# Patient Record
Sex: Male | Born: 1970 | Race: White | Hispanic: No | Marital: Single | Smoking: Never smoker
Health system: Southern US, Community
[De-identification: ages and names within clinical notes are randomized; demographics above are authoritative.]

## PROBLEM LIST (undated history)

## (undated) DIAGNOSIS — E785 Hyperlipidemia, unspecified: Secondary | ICD-10-CM

## (undated) DIAGNOSIS — M109 Gout, unspecified: Secondary | ICD-10-CM

## (undated) DIAGNOSIS — I10 Essential (primary) hypertension: Secondary | ICD-10-CM

## (undated) HISTORY — PX: DENTAL SURGERY: SHX609

---

## 2001-06-20 ENCOUNTER — Encounter: Payer: Self-pay | Admitting: Emergency Medicine

## 2001-06-20 ENCOUNTER — Emergency Department (HOSPITAL_COMMUNITY): Admission: EM | Admit: 2001-06-20 | Discharge: 2001-06-20 | Payer: Self-pay | Admitting: Emergency Medicine

## 2001-06-24 ENCOUNTER — Ambulatory Visit (HOSPITAL_COMMUNITY): Admission: RE | Admit: 2001-06-24 | Discharge: 2001-06-24 | Payer: Self-pay | Admitting: Cardiology

## 2003-07-01 ENCOUNTER — Emergency Department (HOSPITAL_COMMUNITY): Admission: EM | Admit: 2003-07-01 | Discharge: 2003-07-01 | Payer: Self-pay | Admitting: *Deleted

## 2003-12-28 ENCOUNTER — Emergency Department (HOSPITAL_COMMUNITY): Admission: EM | Admit: 2003-12-28 | Discharge: 2003-12-28 | Payer: Self-pay | Admitting: Emergency Medicine

## 2004-07-04 ENCOUNTER — Inpatient Hospital Stay (HOSPITAL_COMMUNITY): Admission: EM | Admit: 2004-07-04 | Discharge: 2004-07-06 | Payer: Self-pay | Admitting: Emergency Medicine

## 2004-07-04 ENCOUNTER — Ambulatory Visit: Payer: Self-pay | Admitting: Cardiology

## 2005-03-22 ENCOUNTER — Emergency Department (HOSPITAL_COMMUNITY): Admission: EM | Admit: 2005-03-22 | Discharge: 2005-03-22 | Payer: Self-pay | Admitting: Emergency Medicine

## 2011-05-04 ENCOUNTER — Emergency Department (HOSPITAL_COMMUNITY)
Admission: EM | Admit: 2011-05-04 | Discharge: 2011-05-04 | Disposition: A | Discharge status: Home / Self Care | Payer: Self-pay | Attending: Emergency Medicine | Admitting: Emergency Medicine

## 2011-05-04 ENCOUNTER — Emergency Department (HOSPITAL_COMMUNITY): Payer: Self-pay

## 2011-05-04 ENCOUNTER — Encounter (HOSPITAL_COMMUNITY): Payer: Self-pay

## 2011-05-04 DIAGNOSIS — W208XXA Other cause of strike by thrown, projected or falling object, initial encounter: Secondary | ICD-10-CM | POA: Insufficient documentation

## 2011-05-04 DIAGNOSIS — R209 Unspecified disturbances of skin sensation: Secondary | ICD-10-CM | POA: Insufficient documentation

## 2011-05-04 DIAGNOSIS — S93409A Sprain of unspecified ligament of unspecified ankle, initial encounter: Secondary | ICD-10-CM | POA: Insufficient documentation

## 2011-05-04 DIAGNOSIS — M25579 Pain in unspecified ankle and joints of unspecified foot: Secondary | ICD-10-CM | POA: Insufficient documentation

## 2011-05-04 MED ORDER — HYDROCODONE-ACETAMINOPHEN 5-325 MG PO TABS: ORAL_TABLET | ORAL | Status: DC

## 2011-05-04 NOTE — ED Provider Notes (Signed)
History     CSN: [id number]  Arrival date & time 05/04/11  1247   First MD Initiated Contact with Patient 05/04/11 1403      Chief Complaint  Patient presents with  . Ankle Pain    (Consider location/radiation/quality/duration/timing/severity/associated sxs/prior treatment) Patient is a 41 y.o. male presenting with ankle pain. The history is provided by the patient.  Ankle Pain  The incident occurred 3 to 5 hours ago. Injury mechanism: Tree limb fell on the right ankle. The pain is present in the right ankle. The quality of the pain is described as sharp. The pain is moderate. The pain has been constant since onset. Associated symptoms include tingling. Pertinent negatives include no loss of sensation. He reports no foreign bodies present. The symptoms are aggravated by bearing weight and palpation. He has tried nothing for the symptoms.    History reviewed. No pertinent past medical history.  History reviewed. No pertinent past surgical history.  No family history on file.  History  Substance Use Topics  . Smoking status: Never Smoker   . Smokeless tobacco: Not on file  . Alcohol Use: Yes      Review of Systems  Constitutional: Negative for activity change.       All ROS Neg except as noted in HPI  HENT: Negative for nosebleeds and neck pain.   Eyes: Negative for photophobia and discharge.  Respiratory: Negative for cough, shortness of breath and wheezing.   Cardiovascular: Negative for chest pain and palpitations.  Gastrointestinal: Negative for abdominal pain and blood in stool.  Genitourinary: Negative for dysuria, frequency and hematuria.  Musculoskeletal: Negative for back pain and arthralgias.  Skin: Negative.   Neurological: Positive for tingling. Negative for dizziness, seizures and speech difficulty.  Psychiatric/Behavioral: Negative for hallucinations and confusion.    Allergies  Review of patient's allergies indicates no known allergies.  Home  Medications   Current Outpatient Rx  Name Route Sig Dispense Refill  . ROSUVASTATIN CALCIUM 20 MG PO TABS Oral Take 20 mg by mouth daily.      BP 161/89  Pulse 73  Temp(Src) 98 F (36.7 C) (Oral)  Resp 18  Ht 6' (1.829 m)  Wt 245 lb (111.131 kg)  BMI 33.23 kg/m2  SpO2 99%  Physical Exam  Nursing note and vitals reviewed. Constitutional: He is oriented to person, place, and time. He appears well-developed and well-nourished.  Non-toxic appearance.  HENT:  Head: Normocephalic.  Right Ear: Tympanic membrane and external ear normal.  Left Ear: Tympanic membrane and external ear normal.  Eyes: EOM and lids are normal. Pupils are equal, round, and reactive to light.  Neck: Normal range of motion. Neck supple. Carotid bruit is not present.  Cardiovascular: Normal rate, regular rhythm, normal heart sounds, intact distal pulses and normal pulses.   Pulmonary/Chest: Breath sounds normal. No respiratory distress.  Abdominal: Soft. Bowel sounds are normal. There is no tenderness. There is no guarding.  Musculoskeletal: Normal range of motion.       Pain of the lateral malleolus. FROM of the toes. Achilles intact.  Lymphadenopathy:       Head (right side): No submandibular adenopathy present.       Head (left side): No submandibular adenopathy present.    He has no cervical adenopathy.  Neurological: He is alert and oriented to person, place, and time. He has normal strength. No cranial nerve deficit or sensory deficit.  Skin: Skin is warm and dry.  Psychiatric: He has a normal mood  and affect. His speech is normal.    ED Course  Procedures (including critical care time)  Labs Reviewed - No data to display Dg Ankle Complete Right  05/04/2011  *RADIOLOGY REPORT*  Clinical Data: Right-sided ankle pain.  RIGHT ANKLE - COMPLETE 3+ VIEW  Comparison: No priors.  Findings: Three views of the right ankle demonstrate no acute fracture, subluxation, dislocation or joint abnormality.  There is a  small amount of soft tissue thickening overlying the lateral malleolus.  IMPRESSION: 1.  No acute radiographic abnormality of the bones of the right ankle.  Original Report Authenticated By: Florencia Reasons, M.D.     1. Ankle sprain       MDM  I have reviewed nursing notes, vital signs, and all appropriate lab and imaging results for this patient. The ankle x-rays are negative. Patient fitted with an ASO splint. Crutches offered but declined. Prescription for Norco given. Patient is to have ibuprofen 3 times daily.       Kathie Dike, [place] 05/04/11 [phone]

## 2011-05-04 NOTE — ED Notes (Signed)
Pt presents with right ankle pain when a tree limb fell on ankle today. Pt ambulates with steady gate.

## 2011-05-04 NOTE — Discharge Instructions (Signed)
Dear ankle a 3 is negative for fracture or dislocation. Please use the ankle splint for the next 10-14 days. Please do not sleep in a splint. Please use ibuprofen 3 times daily with food for inflammation. Please use Norco for pain if needed. This medication may cause drowsiness, please use with caution.Ankle Sprain You have a sprained ankle. When you twist or sprain your ankle, the ligaments that hold the joint together are injured. This usually causes a lot of swelling and pain. Although these injuries can be quite severe, proper treatment will reduce your pain, shorten the period of disability, and help prevent re-injury. To treat a sprained ankle you should:  Elevate your ankle for the next 2 to 4 days.   During this period apply ice packs to the injury for 20 to 30 minutes every 2 to 3 hours.   Keep the ankle wrapped in a compression bandage or splint as long as it is painful or swollen.   Do not walk on your ankle if it still hurts. This can slow the healing. Gentle range of motion exercises, however, can help decrease disability.   Use crutches if necessary until weight bearing is painless.   Prescription pain medicine may be needed to relieve discomfort.  A plaster or fiberglass splint may be applied initially. As your sprain improves, air, foam or gel-lined braces can be used to protect the ankle from further injury until the joint is completely healed. Ankle rehabilitation exercises may also be used to speed your recovery and make the joint more stable. Most moderate ankle sprains will heal completely in 6 weeks. However, if the sprain is severe, a cast or even surgery may be needed. Restrict your activities and see your doctor for follow-up as advised. If you have persistent pain, further evaluation and x-rays may be needed. Document Released: 03/22/2004 Document Revised: 02/01/2011 Document Reviewed: 02/14/2008 [facility] Patient Information 2012 [place], [place].

## 2011-05-04 NOTE — ED Notes (Signed)
Pain rt ankle, onset this am when cutting limb off tree and fell on ankle

## 2011-05-06 NOTE — ED Provider Notes (Signed)
Medical screening examination/treatment/procedure(s) were performed by non-physician practitioner and as supervising physician I was immediately available for consultation/collaboration.   Shelda Jakes, MD 05/06/11 2025

## 2014-07-13 ENCOUNTER — Encounter (HOSPITAL_COMMUNITY): Payer: Self-pay | Admitting: Emergency Medicine

## 2014-07-13 ENCOUNTER — Emergency Department (HOSPITAL_COMMUNITY)
Admission: EM | Admit: 2014-07-13 | Discharge: 2014-07-13 | Disposition: A | Discharge status: Home / Self Care | Payer: 59 | Attending: Emergency Medicine | Admitting: Emergency Medicine

## 2014-07-13 DIAGNOSIS — Z8639 Personal history of other endocrine, nutritional and metabolic disease: Secondary | ICD-10-CM | POA: Diagnosis not present

## 2014-07-13 DIAGNOSIS — T7840XA Allergy, unspecified, initial encounter: Secondary | ICD-10-CM | POA: Diagnosis not present

## 2014-07-13 DIAGNOSIS — R000 Tachycardia, unspecified: Secondary | ICD-10-CM | POA: Insufficient documentation

## 2014-07-13 DIAGNOSIS — R21 Rash and other nonspecific skin eruption: Secondary | ICD-10-CM | POA: Diagnosis present

## 2014-07-13 HISTORY — DX: Hyperlipidemia, unspecified: E78.5

## 2014-07-13 MED ORDER — FAMOTIDINE IN NACL 20-0.9 MG/50ML-% IV SOLN
20.0000 mg | INTRAVENOUS | Status: AC
  Administered 2014-07-13: 20 mg via INTRAVENOUS
  Filled 2014-07-13: qty 50

## 2014-07-13 MED ORDER — DIPHENHYDRAMINE HCL 50 MG/ML IJ SOLN
50.0000 mg | Freq: Once | INTRAMUSCULAR | Status: AC
  Administered 2014-07-13: 50 mg via INTRAVENOUS
  Filled 2014-07-13: qty 1

## 2014-07-13 MED ORDER — EPINEPHRINE 0.3 MG/0.3ML IJ SOAJ: 0.3000 mg | Freq: Once | INTRAMUSCULAR | Status: DC | PRN

## 2014-07-13 MED ORDER — FAMOTIDINE 20 MG PO TABS: 20.0000 mg | ORAL_TABLET | Freq: Two times a day (BID) | ORAL | Status: DC

## 2014-07-13 MED ORDER — METHYLPREDNISOLONE SODIUM SUCC 125 MG IJ SOLR
125.0000 mg | Freq: Once | INTRAMUSCULAR | Status: AC
  Administered 2014-07-13: 125 mg via INTRAVENOUS
  Filled 2014-07-13: qty 2

## 2014-07-13 MED ORDER — PREDNISONE 20 MG PO TABS: 40.0000 mg | ORAL_TABLET | Freq: Every day | ORAL | Status: DC

## 2014-07-13 MED ORDER — EPINEPHRINE 0.3 MG/0.3ML IJ SOAJ
0.3000 mg | Freq: Once | INTRAMUSCULAR | Status: DC
  Filled 2014-07-13: qty 0.3

## 2014-07-13 MED ORDER — DIPHENHYDRAMINE HCL 25 MG PO TABS: 25.0000 mg | ORAL_TABLET | Freq: Four times a day (QID) | ORAL | Status: DC | PRN

## 2014-07-13 NOTE — ED Notes (Signed)
Pt currently resting without any difficulties, breathing fine, VSS.

## 2014-07-13 NOTE — ED Notes (Signed)
Pt states that he ate a burger and hotdog for lunch and then about 2 hours later he started having diarrhea and started breaking out in hives and itching.  Stated that he felt his throat was closing up some but feels better now.

## 2014-07-13 NOTE — Discharge Instructions (Signed)
You have been diagnosed with an allergic reaction. Usually allergic reactions like this are caused by exposures to something that you either ate or touched or smelled.  It may be related to a number of different exposures including a new perfume, topical creams, soaps, detergents, linens, clothing, medications. Occasionally we do not find an answer for why there is an allergic reaction. These are treated the same way including Benadryl as needed for itching and rash. (This can be used up to 50 mg every 6 hours as needed).  Pepcid 20 mg every night and prednisone once a day for 5 days. Please do not take the Benadryl and drive or take care of children or other imported duties as the Benadryl can make you sleepy.   ° °If you should develop severe or worsening symptoms including difficulty breathing, difficulty swallowing, wheezing or increased coughing or a rash that developed on the inside of your mouth or a worsening rash on your skin, return to the hospital immediately for a recheck. Please call your Dr. in the morning for a recheck in 2 days if you are still having symptoms. If you do not have a Dr. see the list below.  If we have identified the source of your allergic reaction, please avoid this at all costs. This means stopping the medication if it is a new medication or a voiding topical exposures such as creams lotions body soaps or deodorants if this is the source. ° °Allergic Reaction, Mild to Moderate °Allergies may happen from anything your body is sensitive to. This may be food, medications, pollens, chemicals, and nearly anything around you in everyday life that produces allergens. An allergen is anything that causes an allergy producing substance. Allergens cause your body to release allergic antibodies. Through a chain of events, they cause a release of histamine into the blood stream. Histamines are meant to protect you, but they also cause your discomfort. This is why antihistamines are often used  for allergies. Heredity is often a factor in causing allergic reactions. This means you may have some of the same allergies as your parents. °Allergies happen in all age groups. You may have some idea of what caused your reaction. There are many allergens around us. It may be difficult to know what caused your reaction. If this is a first time event, it may never happen again. Allergies cannot be cured but can be controlled with medications. °SYMPTOMS  °You may get some or all of the following problems from allergies. °· Swelling and itching in and around the mouth.  °· Tearing, itchy eyes.  °· Nasal congestion and runny nose.  °· Sneezing and coughing.  °· An itchy red rash or hives.  °· Vomiting or diarrhea.  °· Difficulty breathing.  °Seasonal allergies occur in all age groups. They are seasonal because they usually occur during the same season every year. They may be a reaction to molds, grass pollens, or tree pollens. Other causes of allergies are house dust mite allergens, pet dander and mold spores. These are just a common few of the thousands of allergens around us. All of the symptoms listed above happen when you come in contact with pollens and other allergens. Seasonal allergies are usually not life threatening. They are generally more of a nuisance that can often be handled using medications. °Hay fever is a combination of all or some of the above listed allergy problems. It may often be treated with simple over-the-counter medications such as diphenhydramine. Take medication as   insert for child dosages. TREATMENT AND HOME CARE INSTRUCTIONS If hives or rash are present: Take medications as directed.  You may use an over-the-counter antihistamine (diphenhydramine) for hives and itching as needed. Do not drive or drink alcohol until medications used to treat the reaction have worn off. Antihistamines tend to make people sleepy.  Apply cold cloths (compresses) to the  skin or take baths in cool water. This will help itching. Avoid hot baths or showers. Heat will make a rash and itching worse.  If your allergies persist and become more severe, and over the counter medications are not effective, there are many new medications your caretaker can prescribe. Immunotherapy or desensitizing injections can be used if all else fails. Follow up with your caregiver if problems continue.  SEEK MEDICAL CARE IF:  Your allergies are becoming progressively more troublesome.  You suspect a food allergy. Symptoms generally happen within 30 minutes of eating a food.  Your symptoms have not gone away within 2 days or are getting worse.  You develop new symptoms.  You want to retest yourself or your child with a food or drink you think causes an allergic reaction. Never test yourself or your child of a suspected allergy without being under the watchful eye of your caregivers. A second exposure to an allergen may be life-threatening.  SEEK IMMEDIATE MEDICAL CARE IF: You develop difficulty breathing or wheezing, or have a tight feeling in your chest or throat.  You develop a swollen mouth, hives, swelling, or itching all over your body.  A severe reaction with any of the above problems should be considered life-threatening. If you suddenly develop difficulty breathing call for local emergency medical help. THIS IS AN EMERGENCY. MAKE SURE YOU:  Understand these instructions.  Will watch your condition.  Will get help right away if you are not doing well or get worse.  Document Released: 12/10/2006 Document Revised: 02/01/2011 Document Reviewed: 12/10/2006 [employer] Patient Information 2012 [employer].      

## 2014-07-13 NOTE — ED Notes (Signed)
PT presents to ED with lip swelling, hives and redness to body with reported tightness in his throat. PT not sure of allergen.

## 2014-07-13 NOTE — ED Provider Notes (Signed)
CSN: [id number]     Arrival date & time 07/13/14  1810 History   First MD Initiated Contact with Patient 07/13/14 1820     Chief Complaint  Patient presents with  . Allergic Reaction     (Consider location/radiation/quality/duration/timing/severity/associated sxs/prior Treatment) HPI Comments: The patient is a 45 year old male, he has a history of prior hypercholesterolemia but no longer takes medications. He states that just prior to arrival he was at home, he sat down to use the bathroom and developed diarrhea with acute onset of redness itching and a flushed feeling all over his body. This was associated with a tightness in his throat and numbness of his tongue. He has never had an allergic reaction to anything. His symptoms are persistent, moderate to severe, nothing makes this better or worse. He was not given any medication prior to arrival. He denies any changes in her contacts including topical, oral, inhaled or ingested  Patient is a 44 y.o. male presenting with allergic reaction. The history is provided by the patient.  Allergic Reaction   Past Medical History  Diagnosis Date  . Hyperlipemia    Past Surgical History  Procedure Laterality Date  . Dental surgery     History reviewed. No pertinent family history. History  Substance Use Topics  . Smoking status: Never Smoker   . Smokeless tobacco: Not on file  . Alcohol Use: Yes     Comment: occ    Review of Systems  All other systems reviewed and are negative.     Allergies  Review of patient's allergies indicates no known allergies.  Home Medications   Prior to Admission medications   Medication Sig Start Date End Date Taking? Authorizing Provider  diphenhydrAMINE (BENADRYL) 25 MG tablet Take 1 tablet (25 mg total) by mouth every 6 (six) hours as needed for itching (Rash). 07/13/14   Noemi Chapel, MD  EPINEPHrine (EPIPEN 2-PAK) 0.3 mg/0.3 mL IJ SOAJ injection Inject 0.3 mLs (0.3 mg total) into the muscle once as  needed (for severe allergic reaction). CAll 911 immediately if you have to use this medicine 07/13/14   Noemi Chapel, MD  famotidine (PEPCID) 20 MG tablet Take 1 tablet (20 mg total) by mouth 2 (two) times daily. 07/13/14   Noemi Chapel, MD  HYDROcodone-acetaminophen [facility]) 5-325 MG per tablet 1 0r 2 po q4h prn pain Patient not taking: Reported on 07/13/2014 05/04/11   Lily Kocher, PA-C  predniSONE (DELTASONE) 20 MG tablet Take 2 tablets (40 mg total) by mouth daily. 07/13/14   Noemi Chapel, MD   BP 135/93 mmHg  Pulse 84  Temp(Src) 97.8 F (36.6 C) (Oral)  Resp 16  Ht 6' (1.829 m)  Wt 280 lb (127.007 kg)  BMI 37.97 kg/m2  SpO2 96% Physical Exam  Constitutional: He appears well-developed and well-nourished.  Uncomfortable appearing  HENT:  Head: Normocephalic and atraumatic.  Mouth/Throat: Oropharynx is clear and moist. No oropharyngeal exudate.  Oropharynx is clear and moist, no swelling of the tongue or the lips, phonation normal  Eyes: Conjunctivae and EOM are normal. Pupils are equal, round, and reactive to light. Right eye exhibits no discharge. Left eye exhibits no discharge. No scleral icterus.  No periorbital swelling  Neck: Normal range of motion. Neck supple. No JVD present. No thyromegaly present.  Cardiovascular: Regular rhythm, normal heart sounds and intact distal pulses.  Exam reveals no gallop and no friction rub.   No murmur heard. Mild tachycardia  Pulmonary/Chest: Effort normal and breath sounds normal. No respiratory  distress. He has no wheezes. He has no rales.  No wheezing, normal work of breathing, speaks in full sentences  Abdominal: Soft. Bowel sounds are normal. He exhibits no distension and no mass. There is no tenderness.  Musculoskeletal: Normal range of motion. He exhibits no edema or tenderness.  Lymphadenopathy:    He has no cervical adenopathy.  Neurological: He is alert. Coordination normal.  Skin: Skin is warm and dry. Rash noted. There is erythema.   Diffuse urticaria and papular rash involving the face and trunk extremities 4  Psychiatric: He has a normal mood and affect. His behavior is normal.  Nursing note and vitals reviewed.   ED Course  Procedures (including critical care time) Labs Review Labs Reviewed - No data to display  Imaging Review No results found.    MDM   Final diagnoses:  Allergic reaction, initial encounter    The patient is having an acute allergic reaction, he is experiencing some abnormal sensation in his mouth however on inspection he does not appear to have any swelling, will give epinephrine intramuscular, Solu-Medrol, Pepcid, Benadryl. The patient is likely having early anaphylaxis  Improved very quickly - refused IM epi - pt has normal exam, normal VS, stable for d/c after almost 4 hours of observation,   Will f/u for allergy testing.  Meds given in ED:  Medications  EPINEPHrine (EPI-PEN) injection 0.3 mg (not administered)  methylPREDNISolone sodium succinate (SOLU-MEDROL) 125 mg/2 mL injection 125 mg (125 mg Intravenous Given 07/13/14 1842)  famotidine (PEPCID) IVPB 20 mg premix (0 mg Intravenous Stopped 07/13/14 1912)  diphenhydrAMINE (BENADRYL) injection 50 mg (50 mg Intravenous Given 07/13/14 1842)    New Prescriptions   DIPHENHYDRAMINE (BENADRYL) 25 MG TABLET    Take 1 tablet (25 mg total) by mouth every 6 (six) hours as needed for itching (Rash).   EPINEPHRINE (EPIPEN 2-PAK) 0.3 MG/0.3 ML IJ SOAJ INJECTION    Inject 0.3 mLs (0.3 mg total) into the muscle once as needed (for severe allergic reaction). CAll 911 immediately if you have to use this medicine   FAMOTIDINE (PEPCID) 20 MG TABLET    Take 1 tablet (20 mg total) by mouth 2 (two) times daily.   PREDNISONE (DELTASONE) 20 MG TABLET    Take 2 tablets (40 mg total) by mouth daily.      Noemi Chapel, MD 07/13/14 2152

## 2014-07-13 NOTE — ED Notes (Signed)
Patient and wife where given instruction on use of Epi-pen using teach back method, both participants understood instructions and provided feedback.

## 2014-07-13 NOTE — ED Notes (Signed)
Pt states that he does not want the Epi pen at this time as after Benadryl and Solumedrol administration he states that his symptoms are improving.

## 2014-08-04 ENCOUNTER — Emergency Department
Admission: EM | Admit: 2014-08-04 | Discharge: 2014-08-04 | Disposition: A | Discharge status: Home / Self Care | Payer: 59 | Attending: Emergency Medicine | Admitting: Emergency Medicine

## 2014-08-04 ENCOUNTER — Encounter: Payer: Self-pay | Admitting: *Deleted

## 2014-08-04 DIAGNOSIS — Y9289 Other specified places as the place of occurrence of the external cause: Secondary | ICD-10-CM | POA: Diagnosis not present

## 2014-08-04 DIAGNOSIS — T7840XA Allergy, unspecified, initial encounter: Secondary | ICD-10-CM | POA: Diagnosis not present

## 2014-08-04 DIAGNOSIS — Z79899 Other long term (current) drug therapy: Secondary | ICD-10-CM | POA: Insufficient documentation

## 2014-08-04 DIAGNOSIS — Y9389 Activity, other specified: Secondary | ICD-10-CM | POA: Diagnosis not present

## 2014-08-04 DIAGNOSIS — X58XXXA Exposure to other specified factors, initial encounter: Secondary | ICD-10-CM | POA: Diagnosis not present

## 2014-08-04 DIAGNOSIS — Y998 Other external cause status: Secondary | ICD-10-CM | POA: Insufficient documentation

## 2014-08-04 DIAGNOSIS — R21 Rash and other nonspecific skin eruption: Secondary | ICD-10-CM | POA: Diagnosis present

## 2014-08-04 MED ORDER — FAMOTIDINE 20 MG PO TABS
ORAL_TABLET | ORAL | Status: AC
  Administered 2014-08-04: 20 mg via ORAL
  Filled 2014-08-04: qty 1

## 2014-08-04 MED ORDER — PREDNISONE 20 MG PO TABS
60.0000 mg | ORAL_TABLET | Freq: Once | ORAL | Status: AC
  Administered 2014-08-04: 60 mg via ORAL

## 2014-08-04 MED ORDER — FAMOTIDINE 20 MG PO TABS
20.0000 mg | ORAL_TABLET | Freq: Once | ORAL | Status: AC
  Administered 2014-08-04: 20 mg via ORAL

## 2014-08-04 MED ORDER — PREDNISONE 10 MG PO TABS: ORAL_TABLET | ORAL | Status: DC

## 2014-08-04 MED ORDER — PREDNISONE 20 MG PO TABS
ORAL_TABLET | ORAL | Status: AC
  Administered 2014-08-04: 60 mg via ORAL
  Filled 2014-08-04: qty 3

## 2014-08-04 NOTE — Discharge Instructions (Signed)
You were evaluated for an allergic reaction which did improve after taking Benadryl before he got to the hospital.. We discussed that your symptoms of stomach/gastrointestinal cramping plus skin rash did warrant taking epinephrine injection for a systemic allergic reaction called anaphylaxis. Any symptoms of throat swelling, or trouble breathing would also be an indication to use the EpiPen. You're being given additional anti-inflammatory prednisone here in the emergency department as well as a prescription. Return to emergency room in for any new or worsening skin rash, shortness of breath, trouble breathing, swelling of tongue or throat, or gastric intestinal distress. Take over-the-counter Zantac also called ranitidine 150 mg in the evening for 4 more days. Take 25 mg Benadryl every 4 hours for the next 2 days and then as needed for any symptoms of itching, skin graft, or allergic reaction.  Anaphylactic Reaction An anaphylactic reaction is a sudden, severe allergic reaction that involves the whole body. It can be life threatening. A hospital stay is often required. People with asthma, eczema, or hay fever are slightly more likely to have an anaphylactic reaction. CAUSES  An anaphylactic reaction may be caused by anything to which you are allergic. After being exposed to the allergic substance, your immune system becomes sensitized to it. When you are exposed to that allergic substance again, an allergic reaction can occur. Common causes of an anaphylactic reaction include:  Medicines.  Foods, especially peanuts, wheat, shellfish, milk, and eggs.  Insect bites or stings.  Blood products.  Chemicals, such as dyes, latex, and contrast material used for imaging tests. SYMPTOMS  When an allergic reaction occurs, the body releases histamine and other substances. These substances cause symptoms such as tightening of the airway. Symptoms often develop within seconds or minutes of exposure. Symptoms may  include:  Skin rash or hives.  Itching.  Chest tightness.  Swelling of the eyes, tongue, or lips.  Trouble breathing or swallowing.  Lightheadedness or fainting.  Anxiety or confusion.  Stomach pains, vomiting, or diarrhea.  Nasal congestion.  A fast or irregular heartbeat (palpitations). DIAGNOSIS  Diagnosis is based on your history of recent exposure to allergic substances, your symptoms, and a physical exam. Your caregiver may also perform blood or urine tests to confirm the diagnosis. TREATMENT  Epinephrine medicine is the main treatment for an anaphylactic reaction. Other medicines that may be used for treatment include antihistamines, steroids, and albuterol. In severe cases, fluids and medicine to support blood pressure may be given through an intravenous line (IV). Even if you improve after treatment, you need to be observed to make sure your condition does not get worse. This may require a stay in the hospital. [place] a medical alert bracelet or necklace stating your allergy.  You and your family must learn how to use an anaphylaxis kit or give an epinephrine injection to temporarily treat an emergency allergic reaction. Always carry your epinephrine injection or anaphylaxis kit with you. This can be lifesaving if you have a severe reaction.  Do not drive or perform tasks after treatment until the medicines used to treat your reaction have worn off, or until your caregiver says it is okay.  If you have hives or a rash:  Take medicines as directed by your caregiver.  You may use an over-the-counter antihistamine (diphenhydramine) as needed.  Apply cold compresses to the skin or take baths in cool water. Avoid hot baths or showers. SEEK MEDICAL CARE IF:   You develop symptoms of an allergic  reaction to a new substance. Symptoms may start right away or minutes later.  You develop a rash, hives, or itching.  You develop new symptoms. SEEK  IMMEDIATE MEDICAL CARE IF:   You have swelling of the mouth, difficulty breathing, or wheezing.  You have a tight feeling in your chest or throat.  You develop hives, swelling, or itching all over your body.  You develop severe vomiting or diarrhea.  You feel faint or pass out. This is an emergency. Use your epinephrine injection or anaphylaxis kit as you have been instructed. Call your local emergency services (911 in [place] Even if you improve after the injection, you need to be examined at a hospital emergency department. MAKE SURE YOU:   Understand these instructions.  Will watch your condition.  Will get help right away if you are not doing well or get worse. Document Released: 02/12/2005 Document Revised: 02/17/2013 Document Reviewed: 05/16/2011 [facility] Patient Information 2015 [place], [place]. This information is not intended to replace advice given to you by your health care provider. Make sure you discuss any questions you have with your health care provider.

## 2014-08-04 NOTE — ED Provider Notes (Signed)
[facility] Emergency Department Provider Note   ____________________________________________  Time seen: 4 PM I have reviewed the triage vital signs and the triage nursing note.  HISTORY  Chief Complaint Rash   Historian Patient  HPI Paul Becker is a 44 y.o. male who came in due to symptoms of allergic reaction which happened just prior to arrival today. He had no known exposures however he has had a systemic allergic reaction to an unknown trigger in May and was prescribed an EpiPen. Today he started to have GI distress went the bathroom and started having tingling across his lips and face to his ears. He then developed a rash to his upper arms. He did not want to take the EpiPen because then he was instructed he needs to call 911, so he took a Benadryl and came to the emergency department for evaluation. The time he got here the skin rash was gone and his symptoms of tingling and nausea were also gone. Symptoms are considered moderate.    Past Medical History  Diagnosis Date  . 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   Past Surgical History  Procedure Laterality Date  . Dental surgery      Current Outpatient Rx  Name  Route  Sig  Dispense  Refill  . diphenhydrAMINE (BENADRYL) 25 MG tablet   Oral   Take 1 tablet (25 mg total) by mouth every 6 (six) hours as needed for itching (Rash).   30 tablet   0   . EPINEPHrine (EPIPEN 2-PAK) 0.3 mg/0.3 mL IJ SOAJ injection   Intramuscular   Inject 0.3 mLs (0.3 mg total) into the muscle once as needed (for severe allergic reaction). CAll 911 immediately if you have to use this medicine   2 Device   1   . famotidine (PEPCID) 20 MG tablet   Oral   Take 1 tablet (20 mg total) by mouth 2 (two) times daily.   30 tablet   0   . HYDROcodone-acetaminophen (NORCO) 5-325 MG per tablet      1 0r 2 po q4h prn pain Patient not taking:  Reported on 07/13/2014   15 tablet   0   . predniSONE (DELTASONE) 10 MG tablet      Take 50 mg by mouth for one day Take 40 mg by mouth for one day Take 30 mg by mouth for one day Take 20 milligrams by mouth for one day Take 10 milligrams by mouth for one day   15 tablet   0     Allergies Review of patient's allergies indicates no known allergies.  No family history on file.  Social History History  Substance Use Topics  . Smoking status: Never Smoker   . Smokeless tobacco: Not on file  . Alcohol Use: Yes     Comment: occ    Review of Systems  Constitutional: Negative for fever. Eyes: Negative for visual changes. ENT: Negative for sore throat. Cardiovascular: Negative for chest pain. Respiratory: Negative for shortness of breath. Gastrointestinal: Negative for abdominal pain, vomiting and diarrhea. Genitourinary: Negative for dysuria. Musculoskeletal: Negative for back pain. Skin: Positive for upper extremity skin rash as per history of present illness Neurological: Negative for headaches, focal weakness or numbness.  ____________________________________________   PHYSICAL EXAM:  VITAL SIGNS: ED Triage Vitals  Enc Vitals Group     BP 08/04/14 1548 155/90 mmHg     Pulse  Rate 08/04/14 1548 83     Resp 08/04/14 1548 18     Temp 08/04/14 1548 98.6 F (37 C)     Temp Source 08/04/14 1548 Oral     SpO2 08/04/14 1548 98 %     Weight 08/04/14 1548 281 lb (127.461 kg)     Height 08/04/14 1548 6' (1.829 m)     Head Cir --      Peak Flow --      Pain Score --      Pain Loc --      Pain Edu? --      Excl. in [place]? --      Constitutional: Alert and oriented. Well appearing and in no distress. Eyes: Conjunctivae are normal. PERRL. Normal extraocular movements. ENT   Head: Normocephalic and atraumatic.   Nose: No congestion/rhinnorhea.   Mouth/Throat: Mucous membranes are moist.   Neck: No stridor. Cardiovascular: Normal rate, regular rhythm.  No  murmurs, rubs, or gallops. Respiratory: Normal respiratory effort without tachypnea nor retractions. Breath sounds are clear and equal bilaterally. No wheezes/rales/rhonchi. Gastrointestinal: Soft and nontender. No distention.  Genitourinary: Deferred Musculoskeletal: Nontender with normal range of motion in all extremities. No joint effusions.  No lower extremity tenderness nor edema. Neurologic:  Normal speech and language. No gross focal neurologic deficits are appreciated. Skin:  Skin is warm, dry and intact. No rash noted. Psychiatric: Mood and affect are normal. Speech and behavior are normal. Patient exhibits appropriate insight and judgment.  ____________________________________________   EKG  None ____________________________________________  LABS (pertinent positives/negatives)  None  ____________________________________________  RADIOLOGY Radiologist results reviewed  None __________________________________________  PROCEDURES  Procedure(s) performed: None Critical Care performed: None  ____________________________________________   ED COURSE / ASSESSMENT AND PLAN  Pertinent labs & imaging results that were available during my care of the patient were reviewed by me and considered in my medical decision making (see chart for details).   It sounds that the patient was likely having symptoms of anaphylaxis given the GI plus skin manifestations. However he did treat himself prehospital with Benadryl and now his symptoms are gone. I did discuss with him that this would warrant use of EpiPen. We also discussed the other symptoms of atelectasis including sore throat, swollen, throat, or any trouble breathing. He does understand this. We discussed placing him back on medications for allergic reaction including prednisone, Pepcid, and Benadryl. He does still have his EpiPen prescription and he has his EpiPen with him. I'm to go ahead and discharge him, he has all the  medications that he needs, and I did discuss with him watching carefully over the next few hours for any return of symptoms she should come back to the emergency Department. Patient states he would rather go on home now rather than sit in the emergency department for several hours observation.   ___________________________________________   FINAL CLINICAL IMPRESSION(S) / ED DIAGNOSES   Final diagnoses:  Allergic reaction, initial encounter      Lisa Roca, MD 08/04/14 [phone]

## 2014-08-04 NOTE — ED Notes (Signed)
Pt states he started having a tingling sensation above his lips and face then a rash on bilateral arms about 35-45 minutes ago. Took benadryl immediately, feels like the rash is getting better at time, tingling sensation has subsided.

## 2014-08-04 NOTE — ED Notes (Signed)
Pt informed to return if any life threatening symptoms occur.  

## 2015-01-01 ENCOUNTER — Emergency Department (HOSPITAL_COMMUNITY)
Admission: EM | Admit: 2015-01-01 | Discharge: 2015-01-02 | Disposition: A | Discharge status: Home / Self Care | Payer: 59 | Attending: Emergency Medicine | Admitting: Emergency Medicine

## 2015-01-01 ENCOUNTER — Emergency Department (HOSPITAL_COMMUNITY): Payer: 59

## 2015-01-01 ENCOUNTER — Encounter (HOSPITAL_COMMUNITY): Payer: Self-pay

## 2015-01-01 DIAGNOSIS — Z79899 Other long term (current) drug therapy: Secondary | ICD-10-CM | POA: Insufficient documentation

## 2015-01-01 DIAGNOSIS — S8011XA Contusion of right lower leg, initial encounter: Secondary | ICD-10-CM

## 2015-01-01 DIAGNOSIS — S8991XA Unspecified injury of right lower leg, initial encounter: Secondary | ICD-10-CM | POA: Diagnosis present

## 2015-01-01 DIAGNOSIS — Y9289 Other specified places as the place of occurrence of the external cause: Secondary | ICD-10-CM | POA: Insufficient documentation

## 2015-01-01 DIAGNOSIS — Y998 Other external cause status: Secondary | ICD-10-CM | POA: Insufficient documentation

## 2015-01-01 DIAGNOSIS — Y9352 Activity, horseback riding: Secondary | ICD-10-CM | POA: Diagnosis not present

## 2015-01-01 DIAGNOSIS — S9001XA Contusion of right ankle, initial encounter: Secondary | ICD-10-CM | POA: Diagnosis not present

## 2015-01-01 DIAGNOSIS — M109 Gout, unspecified: Secondary | ICD-10-CM | POA: Diagnosis not present

## 2015-01-01 DIAGNOSIS — Z8639 Personal history of other endocrine, nutritional and metabolic disease: Secondary | ICD-10-CM | POA: Insufficient documentation

## 2015-01-01 HISTORY — DX: Gout, unspecified: M10.9

## 2015-01-01 MED ORDER — HYDROCODONE-ACETAMINOPHEN 5-325 MG PO TABS: 2.0000 | ORAL_TABLET | ORAL | Status: DC | PRN

## 2015-01-01 MED ORDER — IBUPROFEN 800 MG PO TABS: 800.0000 mg | ORAL_TABLET | Freq: Three times a day (TID) | ORAL | Status: DC

## 2015-01-01 MED ORDER — ONDANSETRON HCL 4 MG/2ML IJ SOLN
4.0000 mg | Freq: Once | INTRAMUSCULAR | Status: AC
  Administered 2015-01-01: 4 mg via INTRAMUSCULAR
  Filled 2015-01-01: qty 2

## 2015-01-01 MED ORDER — HYDROMORPHONE HCL 2 MG/ML IJ SOLN
2.0000 mg | Freq: Once | INTRAMUSCULAR | Status: AC
Start: 2015-01-01 — End: 2015-01-01
  Administered 2015-01-01: 2 mg via INTRAMUSCULAR
  Filled 2015-01-01: qty 1

## 2015-01-01 NOTE — ED Provider Notes (Signed)
CSN: [id number]     Arrival date & time 01/01/15  2124 History   First MD Initiated Contact with Patient 01/01/15 2141     Chief Complaint  Patient presents with  . Leg Injury     (Consider location/radiation/quality/duration/timing/severity/associated sxs/prior Treatment) Patient is a 44 y.o. male presenting with leg pain. The history is provided by the patient. No language interpreter was used.  Leg Pain Location:  Ankle and leg Time since incident:  2 hours Injury: yes   Mechanism of injury comment:  Horse fell on leg Leg location:  R leg Ankle location:  R ankle Pain details:    Quality:  Aching   Radiates to:  Does not radiate   Severity:  Moderate   Onset quality:  Gradual   Duration:  3 hours   Timing:  Constant   Progression:  Worsening Chronicity:  New Dislocation: no   Foreign body present:  No foreign bodies Tetanus status:  Up to date Prior injury to area:  No Relieved by:  Nothing Worsened by:  Bearing weight Ineffective treatments:  None tried Associated symptoms: swelling   Risk factors: no concern for non-accidental trauma   Pt complains of swelling and pain to right ankle and right leg  Past Medical History  Diagnosis Date  . Hyperlipemia   . Gout    Past Surgical History  Procedure Laterality Date  . Dental surgery     No family history on file. Social History  Substance Use Topics  . Smoking status: Never Smoker   . Smokeless tobacco: None  . Alcohol Use: Yes     Comment: occ    Review of Systems  Musculoskeletal: Positive for myalgias and joint swelling.  All other systems reviewed and are negative.     Allergies  Review of patient's allergies indicates no known allergies.  Home Medications   Prior to Admission medications   Medication Sig Start Date End Date Taking? Authorizing Provider  diphenhydrAMINE (BENADRYL) 25 MG tablet Take 1 tablet (25 mg total) by mouth every 6 (six) hours as needed for itching (Rash). 07/13/14    Noemi Chapel, MD  EPINEPHrine (EPIPEN 2-PAK) 0.3 mg/0.3 mL IJ SOAJ injection Inject 0.3 mLs (0.3 mg total) into the muscle once as needed (for severe allergic reaction). CAll 911 immediately if you have to use this medicine 07/13/14   Noemi Chapel, MD  famotidine (PEPCID) 20 MG tablet Take 1 tablet (20 mg total) by mouth 2 (two) times daily. 07/13/14   Noemi Chapel, MD  HYDROcodone-acetaminophen [facility]) 5-325 MG per tablet 1 0r 2 po q4h prn pain Patient not taking: Reported on 07/13/2014 05/04/11   Lily Kocher, PA-C  predniSONE (DELTASONE) 10 MG tablet Take 50 mg by mouth for one day Take 40 mg by mouth for one day Take 30 mg by mouth for one day Take 20 milligrams by mouth for one day Take 10 milligrams by mouth for one day 08/04/14   Lisa Roca, MD   BP 147/98 mmHg  Pulse 105  Temp(Src) 98.4 F (36.9 C) (Oral)  Resp 20  SpO2 98% Physical Exam  Constitutional: He is oriented to person, place, and time. He appears well-developed and well-nourished.  HENT:  Head: Normocephalic.  Pulmonary/Chest: Effort normal.  Musculoskeletal: He exhibits tenderness.  Swollen bruised right  leg mid shin to ankle, pain with movement,  nv and ns intact  Neurological: He is alert and oriented to person, place, and time. He has normal reflexes.  Skin: Skin is  warm.  Psychiatric: He has a normal mood and affect.  Nursing note and vitals reviewed.   ED Course  Procedures (including critical care time) Labs Review Labs Reviewed - No data to display  Imaging Review No results found. I have personally reviewed and evaluated these images and lab results as part of my medical decision-making.   EKG Interpretation None      MDM   Final diagnoses:  Contusion of right leg, initial encounter  Contusion of right ankle, initial encounter    Cam walker Crutches Hydrocodone Ibuprofen Schedule to Dr. Erlinda Hong for evaluation.       Fransico Meadow, PA-C 01/01/15 [place], MD 01/02/15 [phone]

## 2015-01-01 NOTE — Discharge Instructions (Signed)

## 2015-01-01 NOTE — ED Notes (Addendum)
I was trying to get over a fence away from the horse and the horse knocked me over and landed on my right lower leg, ankle, and foot. Right lower leg swollen, red, and abrasions noted to Right lower leg.

## 2016-10-13 ENCOUNTER — Emergency Department (HOSPITAL_COMMUNITY)
Admission: EM | Admit: 2016-10-13 | Discharge: 2016-10-13 | Disposition: A | Discharge status: Home / Self Care | Payer: BLUE CROSS/BLUE SHIELD | Attending: Emergency Medicine | Admitting: Emergency Medicine

## 2016-10-13 ENCOUNTER — Encounter (HOSPITAL_COMMUNITY): Payer: Self-pay | Admitting: Emergency Medicine

## 2016-10-13 DIAGNOSIS — T7840XA Allergy, unspecified, initial encounter: Secondary | ICD-10-CM | POA: Insufficient documentation

## 2016-10-13 DIAGNOSIS — Z79899 Other long term (current) drug therapy: Secondary | ICD-10-CM | POA: Diagnosis not present

## 2016-10-13 DIAGNOSIS — R21 Rash and other nonspecific skin eruption: Secondary | ICD-10-CM | POA: Diagnosis present

## 2016-10-13 MED ORDER — FAMOTIDINE 20 MG PO TABS: 20.0000 mg | ORAL_TABLET | Freq: Two times a day (BID) | ORAL | 0 refills | Status: AC

## 2016-10-13 MED ORDER — PREDNISONE 50 MG PO TABS: ORAL_TABLET | ORAL | 0 refills | Status: DC

## 2016-10-13 MED ORDER — METHYLPREDNISOLONE SODIUM SUCC 125 MG IJ SOLR
125.0000 mg | Freq: Once | INTRAMUSCULAR | Status: AC
  Administered 2016-10-13: 125 mg via INTRAVENOUS
  Filled 2016-10-13: qty 2

## 2016-10-13 MED ORDER — DIPHENHYDRAMINE HCL 50 MG/ML IJ SOLN
25.0000 mg | Freq: Once | INTRAMUSCULAR | Status: AC
  Administered 2016-10-13: 25 mg via INTRAVENOUS
  Filled 2016-10-13: qty 1

## 2016-10-13 MED ORDER — EPINEPHRINE 0.3 MG/0.3ML IJ SOAJ: 0.3000 mg | Freq: Once | INTRAMUSCULAR | 0 refills | Status: AC | PRN

## 2016-10-13 MED ORDER — FAMOTIDINE IN NACL 20-0.9 MG/50ML-% IV SOLN
20.0000 mg | Freq: Once | INTRAVENOUS | Status: AC
  Administered 2016-10-13: 20 mg via INTRAVENOUS
  Filled 2016-10-13: qty 50

## 2016-10-13 MED ORDER — DIPHENHYDRAMINE HCL 25 MG PO TABS: 25.0000 mg | ORAL_TABLET | Freq: Four times a day (QID) | ORAL | 0 refills | Status: AC | PRN

## 2016-10-13 NOTE — ED Provider Notes (Signed)
[place] DEPT Provider Note   CSN: [id number] Arrival date & time: 10/13/16  0206     History   Chief Complaint Chief Complaint  Patient presents with  . Allergic Reaction    HPI Paul Becker is a 46 y.o. male.  Patient awoke from sleep about 1 hour ago with itchy rash to his bilateral arms and legs. This is associated with tightness feeling in his throat and numbness of his tongue. Has had similar symptoms in the past with anaphylaxis and has an epinephrine pen at home. He denies did not use it because it is expired. Patient states he woke any immediately had some bowel cramping and diarrhea as well. This was followed by the feeling in his throat and on the rash. Denies any chest pain or shortness of breath. Took a Benadryl at home. Denies any other new medications or exposures. Symptoms are progressively improving. He feels almost back to baseline. He is concerned because he is leaving town [employer]. He feels that he is not have any throat tightness or throat swelling at this time. No chest pain or shortness of breath.   The history is provided by the patient.  Allergic Reaction  Presenting symptoms: rash     Past Medical History:  Diagnosis Date  . Gout   . Hyperlipemia     There are no active problems to display for this patient.   Past Surgical History:  Procedure Laterality Date  . DENTAL SURGERY         Home Medications    Prior to Admission medications   Medication Sig Start Date End Date Taking? Authorizing Provider  colchicine 0.6 MG tablet Take 0.6 mg by mouth daily.    [provider]  diphenhydrAMINE (BENADRYL) 25 MG tablet Take 1 tablet (25 mg total) by mouth every 6 (six) hours as needed for itching (Rash). 07/13/14   Noemi Chapel, MD  EPINEPHrine (EPIPEN 2-PAK) 0.3 mg/0.3 mL IJ SOAJ injection Inject 0.3 mLs (0.3 mg total) into the muscle once as needed (for severe allergic reaction). CAll 911 immediately if you have to use this  medicine 07/13/14   Noemi Chapel, MD  famotidine (PEPCID) 20 MG tablet Take 1 tablet (20 mg total) by mouth 2 (two) times daily. 07/13/14   Noemi Chapel, MD  HYDROcodone-acetaminophen (NORCO/VICODIN) 5-325 MG tablet Take 2 tablets by mouth every 4 (four) hours as needed. 01/01/15   Fransico Meadow, PA-C  ibuprofen (ADVIL,MOTRIN) 800 MG tablet Take 1 tablet (800 mg total) by mouth 3 (three) times daily. 01/01/15   Fransico Meadow, PA-C  methylPREDNISolone (MEDROL DOSEPAK) 4 MG TBPK tablet Take by mouth as directed. 12/27/14   [provider]  predniSONE (DELTASONE) 10 MG tablet Take 50 mg by mouth for one day Take 40 mg by mouth for one day Take 30 mg by mouth for one day Take 20 milligrams by mouth for one day Take 10 milligrams by mouth for one day 08/04/14   Lisa Roca, MD    Family History No family history on file.  Social History Social History  Substance Use Topics  . Smoking status: Never Smoker  . Smokeless tobacco: Never Used  . Alcohol use Yes     Comment: occ     Allergies   Patient has no known allergies.   Review of Systems Review of Systems  Constitutional: Negative for activity change, appetite change and fever.  HENT: Negative for congestion and rhinorrhea.   Respiratory: Negative for cough, chest  tightness and shortness of breath.   Cardiovascular: Negative for chest pain and leg swelling.  Gastrointestinal: Negative for abdominal pain, nausea and vomiting.  Genitourinary: Negative for dysuria and hematuria.  Musculoskeletal: Negative for arthralgias.  Skin: Positive for rash.  Neurological: Positive for numbness. Negative for dizziness.   all other systems are negative except as noted in the HPI and PMH.     Physical Exam Updated Vital Signs BP (!) 149/90   Pulse 74   Temp 97.9 F (36.6 C) (Oral)   Resp 20   Ht 6' (1.829 m)   Wt 136.1 kg (300 lb)   SpO2 98%   BMI 40.69 kg/m   Physical Exam  Constitutional: He is oriented to person,  place, and time. He appears well-developed and well-nourished. No distress.  HENT:  Head: Normocephalic and atraumatic.  Mouth/Throat: Oropharynx is clear and moist. No oropharyngeal exudate.  No swelling of lips, tongue or posterior pharynx  Eyes: Pupils are equal, round, and reactive to light. Conjunctivae and EOM are normal.  Neck: Normal range of motion. Neck supple.  No meningismus.  Cardiovascular: Normal rate, regular rhythm, normal heart sounds and intact distal pulses.   No murmur heard. Pulmonary/Chest: Effort normal and breath sounds normal. No respiratory distress. He has no wheezes.  Abdominal: Soft. There is no tenderness. There is no rebound and no guarding.  Musculoskeletal: Normal range of motion. He exhibits no edema or tenderness.  Neurological: He is alert and oriented to person, place, and time. No cranial nerve deficit. He exhibits normal muscle tone. Coordination normal.  No ataxia on finger to nose bilaterally. No pronator drift. 5/5 strength throughout. CN 2-12 intact.Equal grip strength. Sensation intact.   Skin: Skin is warm. Rash noted.  Scattered erythematous urticaria to arms, legs and back  Psychiatric: He has a normal mood and affect. His behavior is normal.  Nursing note and vitals reviewed.    ED Treatments / Results  Labs (all labs ordered are listed, but only abnormal results are displayed) Labs Reviewed - No data to display  EKG  EKG Interpretation None       Radiology No results found.  Procedures Procedures (including critical care time)  Medications Ordered in ED Medications  famotidine (PEPCID) IVPB 20 mg premix (not administered)  diphenhydrAMINE (BENADRYL) injection 25 mg (not administered)  methylPREDNISolone sodium succinate (SOLU-MEDROL) 125 mg/2 mL injection 125 mg (not administered)     Initial Impression / Assessment and Plan / ED Course  I have reviewed the triage vital signs and the nursing notes.  Pertinent labs &  imaging results that were available during my care of the patient were reviewed by me and considered in my medical decision making (see chart for details).    Patient with itchy rash associated with tightness in his throat and numbness to the tongue which has since resolved. History of similar reaction past. No chest pain or shortness of breath.  Patient declines IM epinephrine. He is given steroids and antihistamines.  Patient observed in the ED with improvement of his rash. His tongue and throat feel back to normal. Discussed with patient that he likely had early anaphylaxis given his GI symptoms as well as rash and throat tightness. Discussed that this would be appropriate time to use epinephrine pen. He feels better and continues to decline epinephrine pen tonight.  Patient observed in the ED for 3 hours without recurrence of symptoms. Will discharge and prednisone and antihistamine. Refill epinephrine pen. Follow-up with PCP. Return precautions  discussed.  Final Clinical Impressions(s) / ED Diagnoses   Final diagnoses:  Allergic reaction, initial encounter    New Prescriptions New Prescriptions   No medications on file     Ezequiel Essex, MD 10/13/16 [phone]

## 2016-10-13 NOTE — ED Triage Notes (Signed)
Pt c/o rash since going to bed tonight at 2300

## 2016-10-13 NOTE — Discharge Instructions (Signed)
Take the medications as prescribed. Use the Epipen for severe difficulty breathing or difficulty swallowing only. Return to the ED if you develop new or worsening symptoms.

## 2017-02-28 IMAGING — DX DG TIBIA/FIBULA 2V*R*
2 series · 2 of 2 positions shown · non-contrast
Comparison: None.

CLINICAL DATA: Knocked over by a horse

EXAM:
RIGHT TIBIA AND FIBULA - 2 VIEW

[tibia ap]
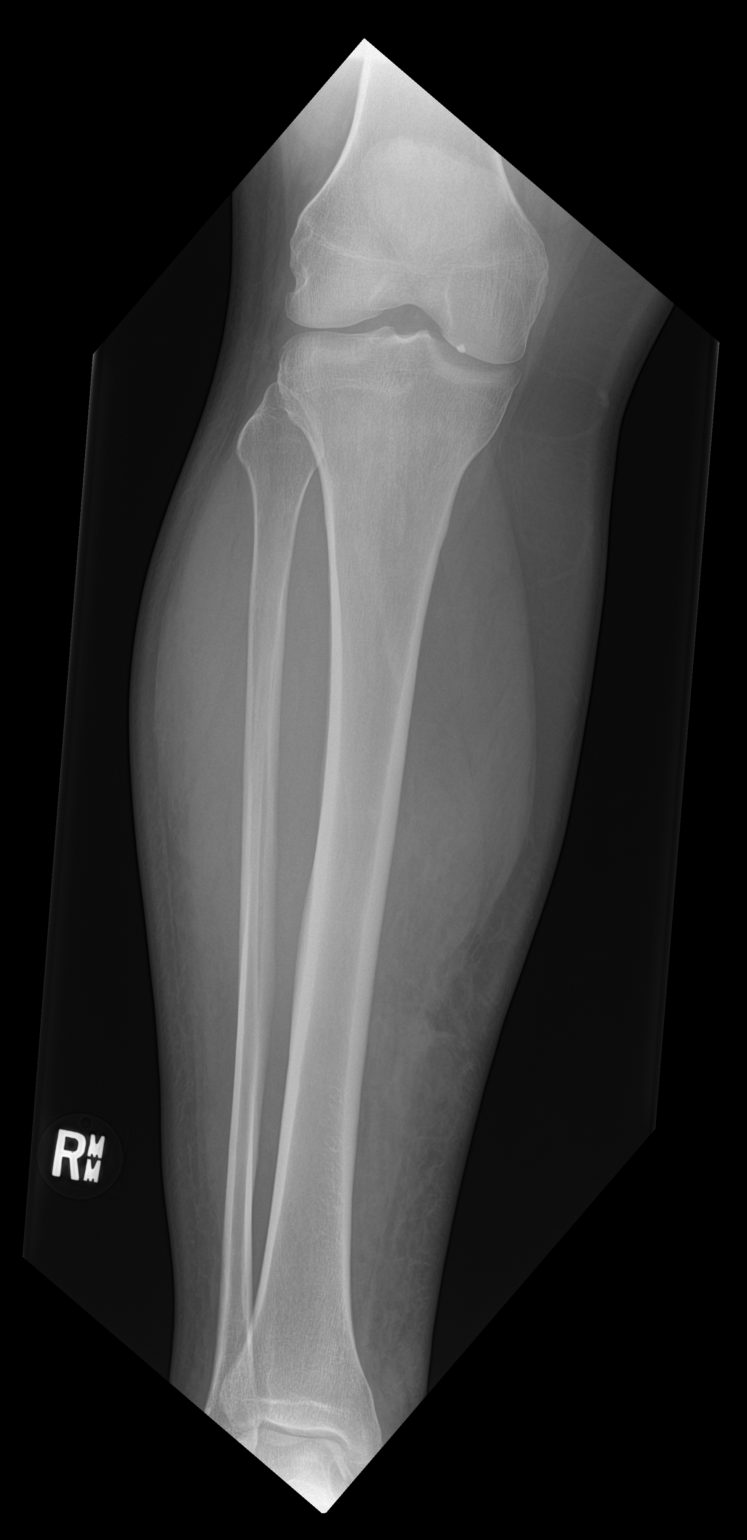

[tibia lat]
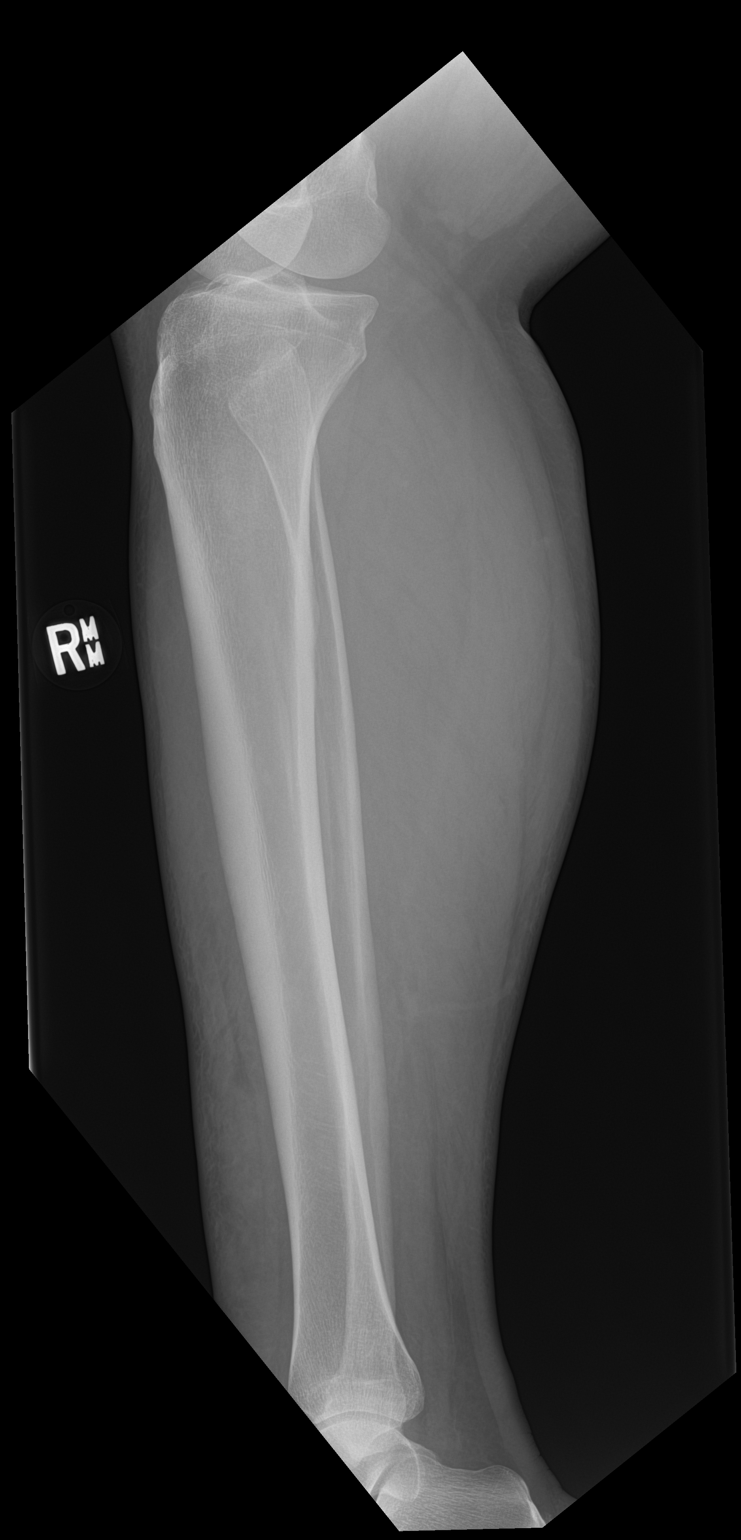

[2 of 2 positions shown; findings below may reference images not displayed]

FINDINGS: There is no evidence of fracture or other focal bone lesions. Soft
tissues are unremarkable.
IMPRESSION: Negative.

## 2017-02-28 IMAGING — DX DG ANKLE COMPLETE 3+V*R*
3 series · 3 of 3 positions shown · non-contrast
Comparison: 05/04/2011

CLINICAL DATA: Knocked over by a horse

EXAM:
RIGHT ANKLE - COMPLETE 3+ VIEW

[ankle ap]
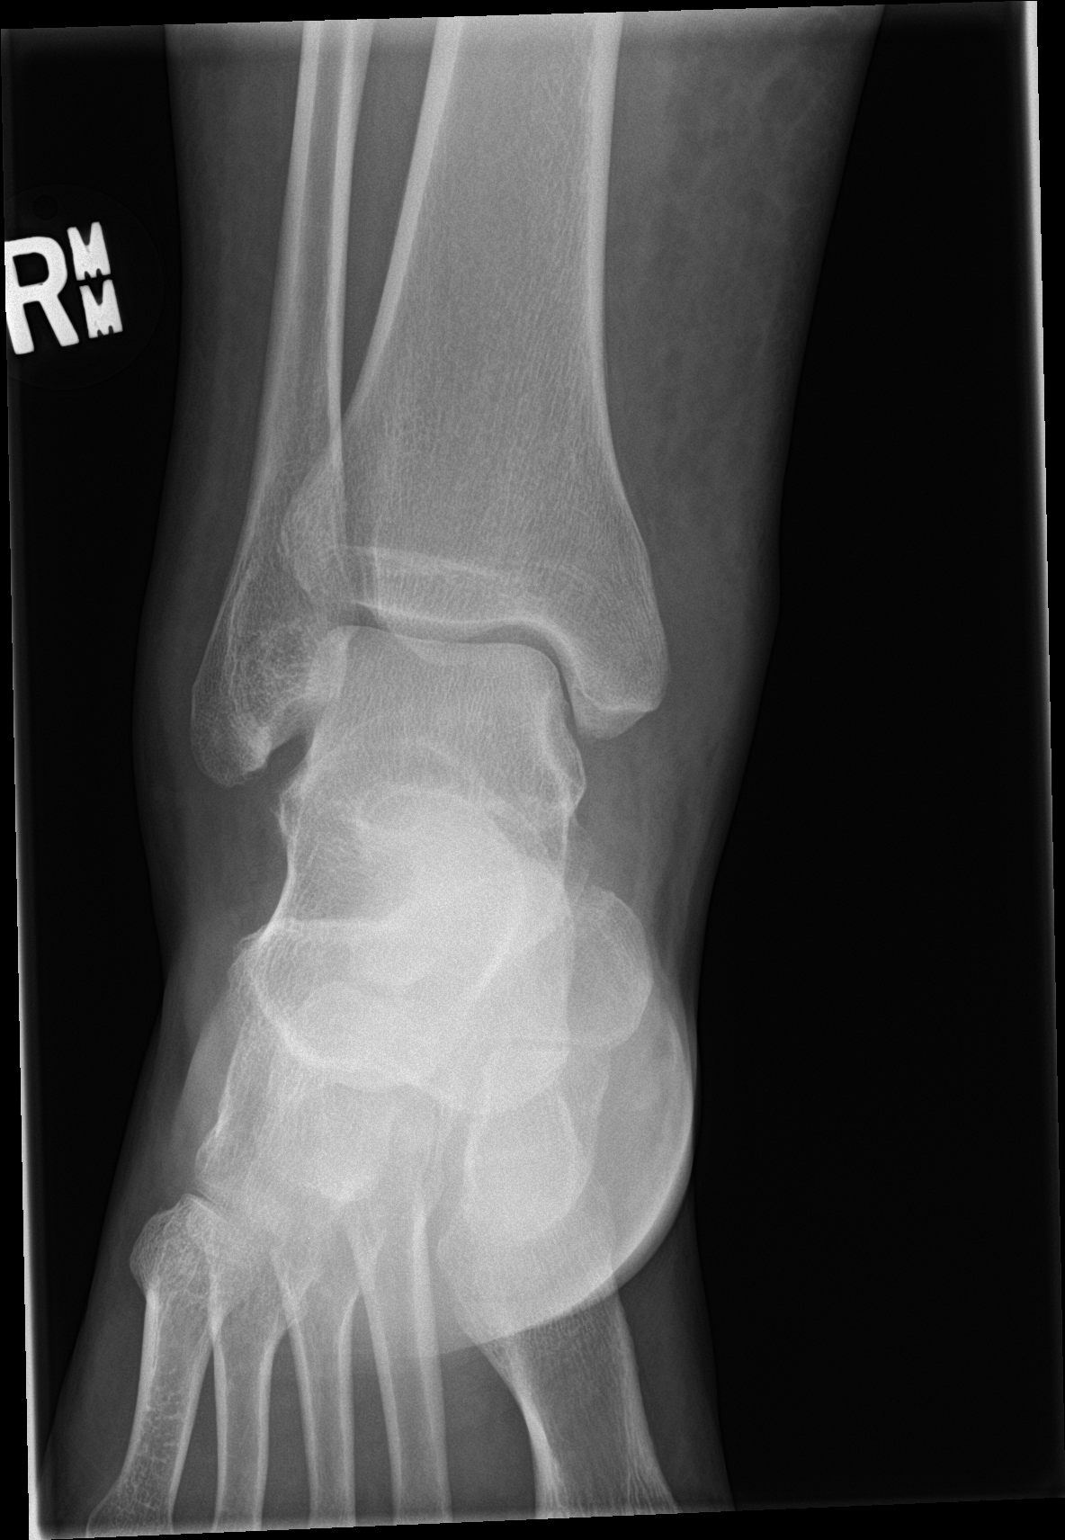

[ankle obl]
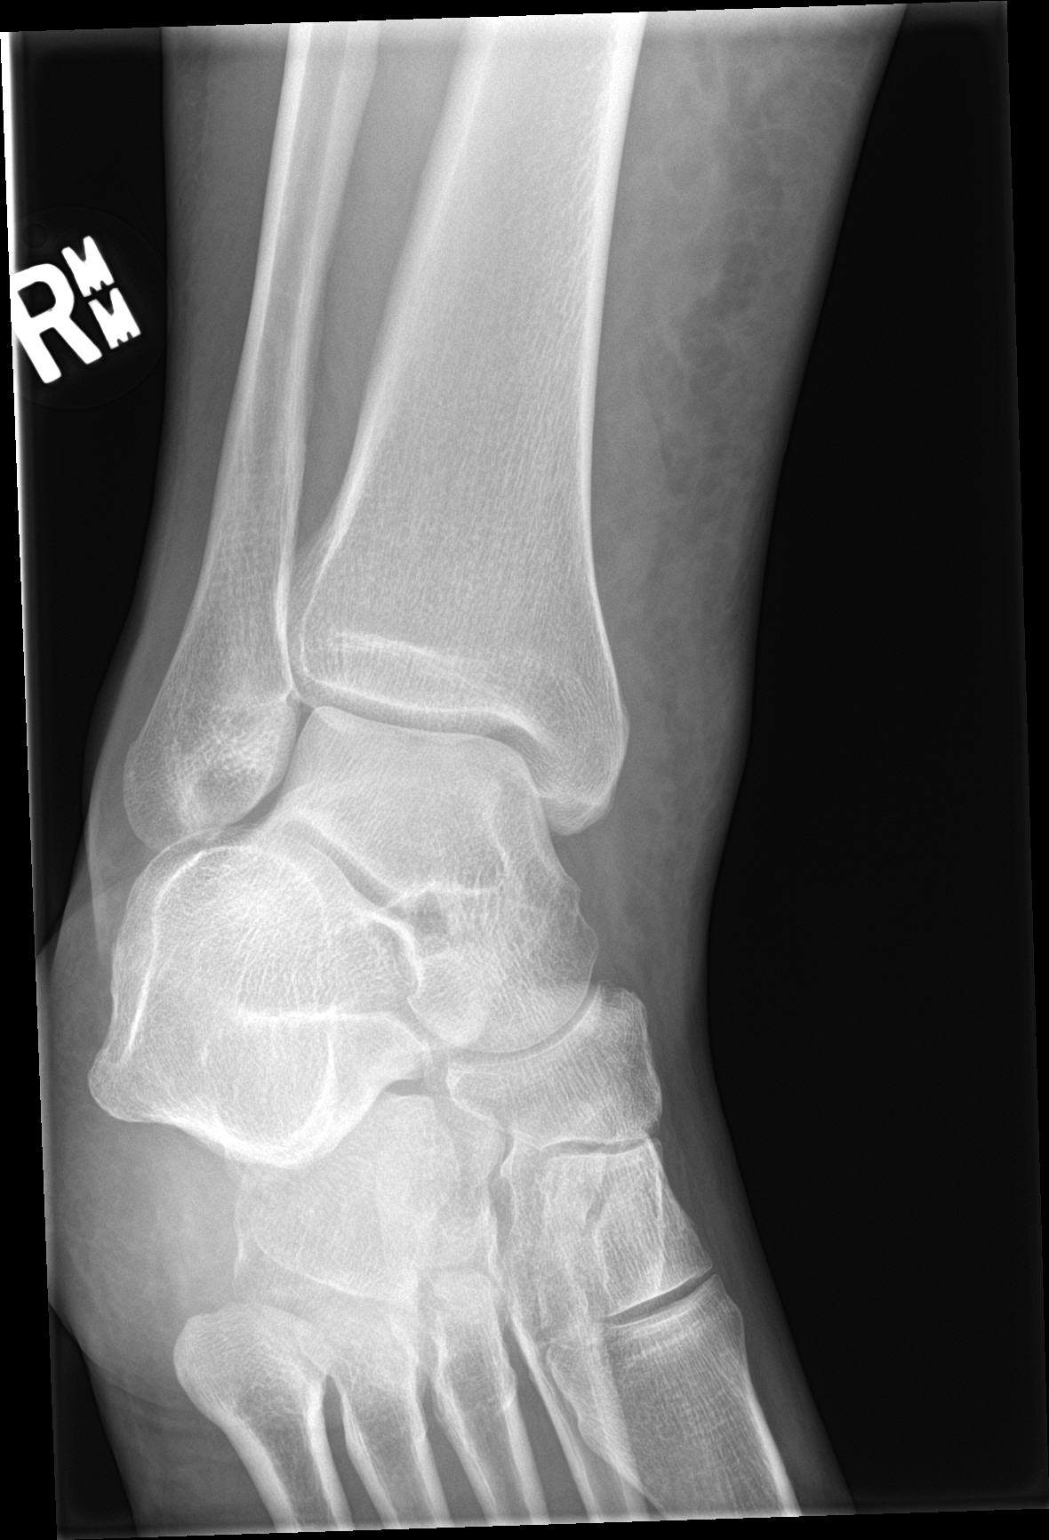

[ankle lat]
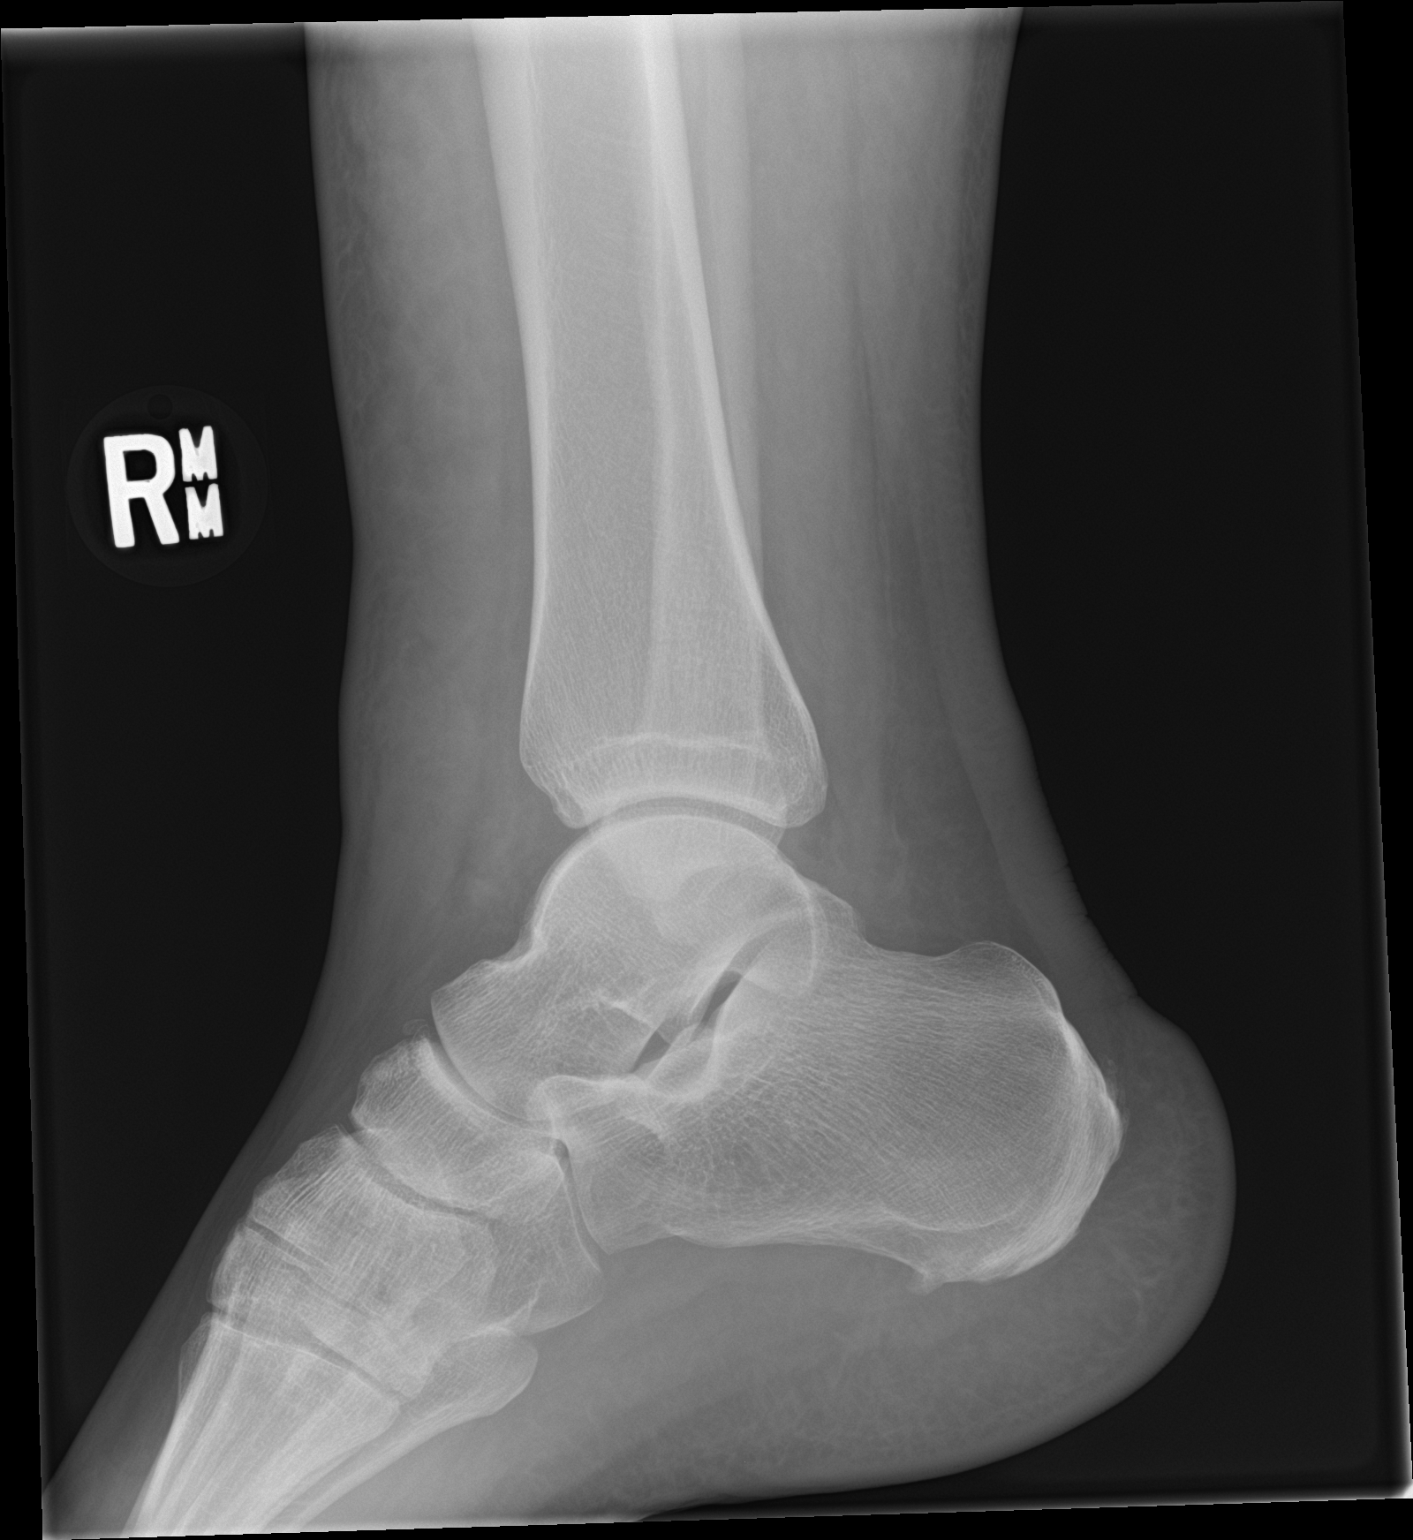

[3 of 3 positions shown; findings below may reference images not displayed]

FINDINGS: There is no evidence of fracture, dislocation, or joint effusion.
There is no evidence of arthropathy or other focal bone abnormality.
Soft tissues are unremarkable.
IMPRESSION: Negative.

## 2019-01-13 ENCOUNTER — Other Ambulatory Visit: Payer: Self-pay

## 2019-01-13 DIAGNOSIS — Z20822 Contact with and (suspected) exposure to covid-19: Secondary | ICD-10-CM

## 2019-01-15 LAB — NOVEL CORONAVIRUS, NAA: SARS-CoV-2, NAA: DETECTED — AB

## 2020-12-06 ENCOUNTER — Encounter: Payer: Self-pay | Admitting: Internal Medicine

## 2021-03-28 ENCOUNTER — Encounter: Payer: Self-pay | Admitting: Internal Medicine

## 2021-04-25 ENCOUNTER — Ambulatory Visit: Payer: BLUE CROSS/BLUE SHIELD | Admitting: Gastroenterology

## 2021-05-10 ENCOUNTER — Other Ambulatory Visit: Payer: Self-pay

## 2021-05-10 ENCOUNTER — Ambulatory Visit (INDEPENDENT_AMBULATORY_CARE_PROVIDER_SITE_OTHER): Payer: Self-pay | Admitting: *Deleted

## 2021-05-10 VITALS — Ht 72.0 in | Wt 325.0 lb

## 2021-05-10 DIAGNOSIS — Z1211 Encounter for screening for malignant neoplasm of colon: Secondary | ICD-10-CM

## 2021-05-10 NOTE — Progress Notes (Signed)
Okay to schedule.  ASA 3 due to BMI.  ? ?Do not take metformin morning of procedure. ?

## 2021-05-10 NOTE — Progress Notes (Addendum)
Gastroenterology Pre-Procedure Review ? ?Request Date: 05/10/2021 ?Requesting Physician: Dr. Leonie Douglas @ [facility], no previous TCS, family hx of colon cancer (mother) ? ?PATIENT REVIEW QUESTIONS: The patient responded to the following health history questions as indicated:   ? ?1. Diabetes Melitis: yes, borderline just started Metformin ?2. Joint replacements in the past 12 months: no ?3. Major health problems in the past 3 months: no ?4. Has an artificial valve or MVP: no ?5. Has a defibrillator: no ?6. Has been advised in past to take antibiotics in advance of a procedure like teeth cleaning: no ?7. Family history of colon cancer: yes, mother: age 54's  ?8. Alcohol Use: no ?9. Illicit drug Use: no ?10. History of sleep apnea: no  ?11. History of coronary artery or other vascular stents placed within the last 12 months: no ?12. History of any prior anesthesia complications: no ?13. Body mass index is 44.08 kg/m?. ?   ?MEDICATIONS & ALLERGIES:    ?Patient reports the following regarding taking any blood thinners:   ?Plavix? no ?Aspirin? no ?Coumadin? no ?Brilinta? no ?Xarelto? no ?Eliquis? no ?Pradaxa? no ?Savaysa? no ?Effient? no ? ?Patient confirms/reports the following medications:  ?Current Outpatient Medications  ?Medication Sig Dispense Refill  ? Ascorbic Acid (VITA-C PO) Take by mouth.    ? colchicine 0.6 MG tablet Take 0.6 mg by mouth daily.    ? diphenhydrAMINE (BENADRYL) 25 MG tablet Take 1 tablet (25 mg total) by mouth every 6 (six) hours as needed for itching (Rash). (Patient taking differently: Take 25 mg by mouth as needed for itching (Rash).) 30 tablet 0  ? EPINEPHrine (EPIPEN 2-PAK) 0.3 mg/0.3 mL IJ SOAJ injection Inject 0.3 mLs (0.3 mg total) into the muscle once as needed (for severe allergic reaction). CAll 911 immediately if you have to use this medicine 2 Device 0  ? famotidine (PEPCID) 20 MG tablet Take 1 tablet (20 mg total) by mouth 2 (two) times daily. (Patient taking  differently: Take 20 mg by mouth daily at 6 (six) AM.) 30 tablet 0  ? Thiamine HCl (VITAMIN B-1 PO) Take by mouth daily at 6 (six) AM.    ? VITAMIN D PO Take by mouth daily at 6 (six) AM.    ? metFORMIN (GLUCOPHAGE) 500 MG tablet Take 500 mg by mouth every morning.    ? ?No current facility-administered medications for this visit.  ? ? ?Patient confirms/reports the following allergies:  ?No Known Allergies ? ?No orders of the defined types were placed in this encounter. ? ? ?AUTHORIZATION INFORMATION ?Primary Insurance: [place],  [place] #: [id number],  Group #: [id number] ?Pre-Cert / Josem Kaufmann required: No, not required ? ?SCHEDULE INFORMATION: ?Procedure has been scheduled as follows:  ?Date: 06/19/2021, Time: 8:00 ?Location: [facility] with Dr. Abbey Chatters ? ?This Gastroenterology Pre-Procedure Review Form is being routed to the following provider(s): Aliene Altes, PA-C ?  ?

## 2021-05-12 NOTE — Progress Notes (Signed)
Tried to call pt.  Voice mail full. ?

## 2021-05-16 ENCOUNTER — Encounter: Payer: Self-pay | Admitting: *Deleted

## 2021-05-16 MED ORDER — NA SULFATE-K SULFATE-MG SULF 17.5-3.13-1.6 GM/177ML PO SOLN: 1.0000 | Freq: Once | ORAL | 0 refills | Status: AC

## 2021-05-16 NOTE — Addendum Note (Signed)
Addended by: Metro Kung on: 05/16/2021 12:00 PM ? ? Modules accepted: Orders ? ?

## 2021-05-17 ENCOUNTER — Encounter: Payer: Self-pay | Admitting: *Deleted

## 2021-05-17 NOTE — Progress Notes (Signed)
Spoke to pt.  Scheduled procedure for 06/19/2021.  Pt aware of Pre-op appointment on 06/15/2021 at 8:00.  He is aware that he will be given procedure time when he goes for Pre-op appointment.  Reviewed prep instructions with pt by phone.  He is aware that I sent in RX to his pharmacy for prep kit.  Pt aware of medication adjustments.  He is aware that I am mailing out all information discussed. Confirmed mailing address. ?

## 2021-06-14 NOTE — Patient Instructions (Signed)
? ? ? ? ? ? Paul Becker ? 06/14/2021  ?  ? '@PREFPERIOPPHARMACY'$ @ ? ? Your procedure is scheduled on  06/19/2021. ? ? Report to [facility] at  [place] A.M. ? ? Call this number if you have problems the morning of surgery: ? [phone] ? ? Remember: ? Follow the diet and prep instructions given to you by the office. ? ?  DO NOT take any metformin the morning of your procedure. ?  ? ? Take these medicines the morning of surgery with A SIP OF WATER  ? ?                                    colchicine, pepcid. ?  ? ? Do not wear jewelry, make-up or nail polish. ? Do not wear lotions, powders, or perfumes, or deodorant. ? Do not shave 48 hours prior to surgery.  Men may shave face and neck. ? Do not bring valuables to the hospital. ? [facility] is not responsible for any belongings or valuables. ? ?Contacts, dentures or bridgework may not be worn into surgery.  Leave your suitcase in the car.  After surgery it may be brought to your room. ? ?For patients admitted to the hospital, discharge time will be determined by your treatment team. ? ?Patients discharged the day of surgery will not be allowed to drive home and must have someone with them for 24 hours.  ? ? ?Special instructions:   DO NOT smoke tobacco or vape for 24 hours before your procedure. ? ?Please read over the following fact sheets that you were given. ?Anesthesia Post-op Instructions and Care and Recovery After Surgery ?  ? ? ? Colonoscopy, Adult, Care After ?The following information offers guidance on how to care for yourself after your procedure. Your health care provider may also give you more specific instructions. If you have problems or questions, contact your health care provider. ?What can I expect after the procedure? ?After the procedure, it is common to have: ?A small amount of blood in your stool for 24 hours after the procedure. ?Some gas. ?Mild cramping or bloating of your abdomen. ?Follow these instructions at home: ?Eating and  drinking ? ?Drink enough fluid to keep your urine pale yellow. ?Follow instructions from your health care provider about eating or drinking restrictions. ?Resume your normal diet as told by your health care provider. Avoid heavy or fried foods that are hard to digest. ?Activity ?Rest as told by your health care provider. ?Avoid sitting for a long time without moving. Get up to take short walks every 1-2 hours. This is important to improve blood flow and breathing. Ask for help if you feel weak or unsteady. ?Return to your normal activities as told by your health care provider. Ask your health care provider what activities are safe for you. ?Managing cramping and bloating ? ?Try walking around when you have cramps or feel bloated. ?If directed, apply heat to your abdomen as told by your health care provider. Use the heat source that your health care provider recommends, such as a moist heat pack or a heating pad. ?Place a towel between your skin and the heat source. ?Leave the heat on for 20-30 minutes. ?Remove the heat if your skin turns bright red. This is especially important if you are unable to feel pain, heat, or cold. You have a greater risk of getting burned. ?General instructions ?  If you were given a sedative during the procedure, it can affect you for several hours. Do not drive or operate machinery until your health care provider says that it is safe. ?For the first 24 hours after the procedure: ?Do not sign important documents. ?Do not drink alcohol. ?Do your regular daily activities at a slower pace than normal. ?Eat soft foods that are easy to digest. ?Take over-the-counter and prescription medicines only as told by your health care provider. ?Keep all follow-up visits. This is important. ?Contact a health care provider if: ?You have blood in your stool 2-3 days after the procedure. ?Get help right away if: ?You have more than a small spotting of blood in your stool. ?You have large blood clots in your  stool. ?You have swelling of your abdomen. ?You have nausea or vomiting. ?You have a fever. ?You have increasing pain in your abdomen that is not relieved with medicine. ?These symptoms may be an emergency. Get help right away. Call 911. ?Do not wait to see if the symptoms will go away. ?Do not drive yourself to the hospital. ?Summary ?After the procedure, it is common to have a small amount of blood in your stool. You may also have mild cramping and bloating of your abdomen. ?If you were given a sedative during the procedure, it can affect you for several hours. Do not drive or operate machinery until your health care provider says that it is safe. ?Get help right away if you have a lot of blood in your stool, nausea or vomiting, a fever, or increased pain in your abdomen. ?This information is not intended to replace advice given to you by your health care provider. Make sure you discuss any questions you have with your health care provider. ?Document Revised: 10/05/2020 Document Reviewed: 10/05/2020 ?Elsevier Patient Education ? [place]. ?Monitored Anesthesia Care, Care After ?This sheet gives you information about how to care for yourself after your procedure. Your health care provider may also give you more specific instructions. If you have problems or questions, contact your health care provider. ?What can I expect after the procedure? ?After the procedure, it is common to have: ?Tiredness. ?Forgetfulness about what happened after the procedure. ?Impaired judgment for important decisions. ?Nausea or vomiting. ?Some difficulty with balance. ?Follow these instructions at home: ?For the time period you were told by your health care provider: ? ?  ? ?Rest as needed. ?Do not participate in activities where you could fall or become injured. ?Do not drive or use machinery. ?Do not drink alcohol. ?Do not take sleeping pills or medicines that cause drowsiness. ?Do not make important decisions or sign legal  documents. ?Do not take care of children on your own. ?Eating and drinking ?Follow the diet that is recommended by your health care provider. ?Drink enough fluid to keep your urine pale yellow. ?If you vomit: ?Drink water, juice, or soup when you can drink without vomiting. ?Make sure you have little or no nausea before eating solid foods. ?General instructions ?Have a responsible adult stay with you for the time you are told. It is important to have someone help care for you until you are awake and alert. ?Take over-the-counter and prescription medicines only as told by your health care provider. ?If you have sleep apnea, surgery and certain medicines can increase your risk for breathing problems. Follow instructions from your health care provider about wearing your sleep device: ?Anytime you are sleeping, including during daytime naps. ?While taking prescription pain  medicines, sleeping medicines, or medicines that make you drowsy. ?Avoid smoking. ?Keep all follow-up visits as told by your health care provider. This is important. ?Contact a health care provider if: ?You keep feeling nauseous or you keep vomiting. ?You feel light-headed. ?You are still sleepy or having trouble with balance after 24 hours. ?You develop a rash. ?You have a fever. ?You have redness or swelling around the IV site. ?Get help right away if: ?You have trouble breathing. ?You have new-onset confusion at home. ?Summary ?For several hours after your procedure, you may feel tired. You may also be forgetful and have poor judgment. ?Have a responsible adult stay with you for the time you are told. It is important to have someone help care for you until you are awake and alert. ?Rest as told. Do not drive or operate machinery. Do not drink alcohol or take sleeping pills. ?Get help right away if you have trouble breathing, or if you suddenly become confused. ?This information is not intended to replace advice given to you by your health care  provider. Make sure you discuss any questions you have with your health care provider. ?Document Revised: 01/17/2021 Document Reviewed: 01/15/2019 ?Elsevier Patient Education ? [place]. ? ?

## 2021-06-15 ENCOUNTER — Encounter (HOSPITAL_COMMUNITY): Payer: Self-pay

## 2021-06-15 ENCOUNTER — Other Ambulatory Visit: Payer: Self-pay

## 2021-06-15 ENCOUNTER — Encounter (HOSPITAL_COMMUNITY)
Admission: RE | Admit: 2021-06-15 | Discharge: 2021-06-15 | Disposition: A | Discharge status: Home / Self Care | Payer: BC Managed Care – PPO | Source: Ambulatory Visit | Attending: Internal Medicine | Admitting: Internal Medicine

## 2021-06-15 VITALS — BP 142/90 | HR 73 | Temp 97.7°F | Resp 18 | Ht 72.0 in | Wt 330.0 lb

## 2021-06-15 DIAGNOSIS — E119 Type 2 diabetes mellitus without complications: Secondary | ICD-10-CM | POA: Diagnosis not present

## 2021-06-15 DIAGNOSIS — Z01818 Encounter for other preprocedural examination: Secondary | ICD-10-CM | POA: Diagnosis present

## 2021-06-15 HISTORY — DX: Essential (primary) hypertension: I10

## 2021-06-15 LAB — BASIC METABOLIC PANEL
Anion gap: 8 (ref 5–15)
BUN: 15 mg/dL (ref 6–20)
CO2: 26 mmol/L (ref 22–32)
Calcium: 9.1 mg/dL (ref 8.9–10.3)
Chloride: 105 mmol/L (ref 98–111)
Creatinine, Ser: 0.84 mg/dL (ref 0.61–1.24)
GFR, Estimated: >60 mL/min (ref >60)
Glucose, Bld: 109 mg/dL — ABNORMAL HIGH (ref 70–99)
Potassium: 4.7 mmol/L (ref 3.5–5.1)
Sodium: 139 mmol/L (ref 135–145)

## 2021-06-19 ENCOUNTER — Ambulatory Visit (HOSPITAL_COMMUNITY)
Admission: RE | Admit: 2021-06-19 | Discharge: 2021-06-19 | Disposition: A | Discharge status: Home / Self Care | Payer: BC Managed Care – PPO | Attending: Internal Medicine | Admitting: Internal Medicine

## 2021-06-19 ENCOUNTER — Encounter (HOSPITAL_COMMUNITY): Payer: Self-pay

## 2021-06-19 ENCOUNTER — Ambulatory Visit (HOSPITAL_COMMUNITY): Payer: BC Managed Care – PPO | Admitting: Anesthesiology

## 2021-06-19 ENCOUNTER — Encounter (HOSPITAL_COMMUNITY)
Admission: RE | Disposition: A | Discharge status: Home / Self Care | Payer: Self-pay | Source: Home / Self Care | Attending: Internal Medicine

## 2021-06-19 DIAGNOSIS — D123 Benign neoplasm of transverse colon: Secondary | ICD-10-CM | POA: Insufficient documentation

## 2021-06-19 DIAGNOSIS — K648 Other hemorrhoids: Secondary | ICD-10-CM | POA: Insufficient documentation

## 2021-06-19 DIAGNOSIS — G4730 Sleep apnea, unspecified: Secondary | ICD-10-CM | POA: Insufficient documentation

## 2021-06-19 DIAGNOSIS — K635 Polyp of colon: Secondary | ICD-10-CM | POA: Diagnosis not present

## 2021-06-19 DIAGNOSIS — Z6841 Body Mass Index (BMI) 40.0 and over, adult: Secondary | ICD-10-CM | POA: Insufficient documentation

## 2021-06-19 DIAGNOSIS — Z1211 Encounter for screening for malignant neoplasm of colon: Secondary | ICD-10-CM

## 2021-06-19 DIAGNOSIS — Z800 Family history of malignant neoplasm of digestive organs: Secondary | ICD-10-CM | POA: Diagnosis not present

## 2021-06-19 DIAGNOSIS — Z79899 Other long term (current) drug therapy: Secondary | ICD-10-CM | POA: Diagnosis not present

## 2021-06-19 DIAGNOSIS — I10 Essential (primary) hypertension: Secondary | ICD-10-CM | POA: Insufficient documentation

## 2021-06-19 HISTORY — PX: COLONOSCOPY WITH PROPOFOL: SHX5780

## 2021-06-19 HISTORY — PX: POLYPECTOMY: SHX5525

## 2021-06-19 LAB — GLUCOSE, CAPILLARY: Glucose-Capillary: 112 mg/dL — ABNORMAL HIGH (ref 70–99)

## 2021-06-19 SURGERY — COLONOSCOPY WITH PROPOFOL
Anesthesia: General

## 2021-06-19 MED ORDER — LIDOCAINE HCL (CARDIAC) PF 100 MG/5ML IV SOSY
PREFILLED_SYRINGE | INTRAVENOUS | Status: DC | PRN
  Administered 2021-06-19: 50 mg via INTRAVENOUS

## 2021-06-19 MED ORDER — LACTATED RINGERS IV SOLN: INTRAVENOUS | Status: DC

## 2021-06-19 MED ORDER — PROPOFOL 10 MG/ML IV BOLUS
INTRAVENOUS | Status: DC | PRN
  Administered 2021-06-19: 100 mg via INTRAVENOUS
  Administered 2021-06-19: 50 mg via INTRAVENOUS
  Administered 2021-06-19: 30 mg via INTRAVENOUS
  Administered 2021-06-19 (×2): 50 mg via INTRAVENOUS

## 2021-06-19 NOTE — H&P (Signed)
?Primary Care Physician:  Jake Samples, PA-C ?Primary Gastroenterologist:  Dr. Abbey Chatters ? ?Pre-Procedure History & Physical: ?HPI:  Paul Becker is a 51 y.o. male is here for first ever colonoscopy for high colon cancer screening purposes/family history of colon cancer in mother.  No melena or hematochezia.  No abdominal pain or unintentional weight loss.  No change in bowel habits.  Overall feels well from a GI standpoint. ? ?Past Medical History:  ?Diagnosis Date  ? Gout   ? Hyperlipemia   ? Hypertension   ? ? ?Past Surgical History:  ?Procedure Laterality Date  ? DENTAL SURGERY    ? ? ?Prior to Admission medications   ?Medication Sig Start Date End Date Taking? Authorizing Provider  ?colchicine 0.6 MG tablet Take 0.6 mg by mouth daily.   Yes [provider]  ?famotidine (PEPCID) 20 MG tablet Take 1 tablet (20 mg total) by mouth 2 (two) times daily. ?Patient taking differently: Take 20 mg by mouth daily at 6 (six) AM. 10/13/16  Yes Rancour, Annie Main, MD  ?lisinopril (ZESTRIL) 10 MG tablet Take 10 mg by mouth daily.   Yes [provider]  ?Thiamine HCl (VITAMIN B-1 PO) Take by mouth daily at 6 (six) AM.   Yes [provider]  ?VITAMIN D PO Take by mouth daily at 6 (six) AM.   Yes [provider]  ?Ascorbic Acid (VITA-C PO) Take by mouth.    [provider]  ?diphenhydrAMINE (BENADRYL) 25 MG tablet Take 1 tablet (25 mg total) by mouth every 6 (six) hours as needed for itching (Rash). ?Patient taking differently: Take 25 mg by mouth as needed for itching (Rash). 10/13/16   Rancour, Annie Main, MD  ?EPINEPHrine (EPIPEN 2-PAK) 0.3 mg/0.3 mL IJ SOAJ injection Inject 0.3 mLs (0.3 mg total) into the muscle once as needed (for severe allergic reaction). CAll 911 immediately if you have to use this medicine 10/13/16   Rancour, Annie Main, MD  ?metFORMIN (GLUCOPHAGE) 500 MG tablet Take 500 mg by mouth every morning. 05/02/21   [provider]  ? ? ?Allergies as of  05/16/2021  ? (No Known Allergies)  ? ? ?History reviewed. No pertinent family history. ? ?Social History  ? ?Socioeconomic History  ? Marital status: Single  ?  Spouse name: Not on file  ? Number of children: Not on file  ? Years of education: Not on file  ? Highest education level: Not on file  ?Occupational History  ? Not on file  ?Tobacco Use  ? Smoking status: Never  ? Smokeless tobacco: Never  ?Substance and Sexual Activity  ? Alcohol use: Yes  ?  Comment: occ  ? Drug use: No  ? Sexual activity: Not on file  ?Other Topics Concern  ? Not on file  ?Social History Narrative  ? Not on file  ? ?Social Determinants of Health  ? ?Financial Resource Strain: Not on file  ?Food Insecurity: Not on file  ?Transportation Needs: Not on file  ?Physical Activity: Not on file  ?Stress: Not on file  ?Social Connections: Not on file  ?Intimate Partner Violence: Not on file  ? ? ?Review of Systems: ?See HPI, otherwise negative ROS ? ?Physical Exam: ?Vital signs in last 24 hours: ?Temp:  [98.4 ?F (36.9 ?C)] 98.4 ?F (36.9 ?C) (04/24 2505) ?Pulse Rate:  [88] 88 (04/24 0724) ?Resp:  [16] 16 (04/24 0724) ?BP: (134)/(92) 134/92 (04/24 0724) ?SpO2:  [97 %] 97 % (04/24 0724) ?Weight:  [149.7 kg] 149.7 kg (04/24  0724) ?  ?General:   Alert,  Well-developed, well-nourished, pleasant and cooperative in NAD ?Head:  Normocephalic and atraumatic. ?Eyes:  Sclera clear, no icterus.   Conjunctiva pink. ?Ears:  Normal auditory acuity. ?Nose:  No deformity, discharge,  or lesions. ?Mouth:  No deformity or lesions, dentition normal. ?Neck:  Supple; no masses or thyromegaly. ?Lungs:  Clear throughout to auscultation.   No wheezes, crackles, or rhonchi. No acute distress. ?Heart:  Regular rate and rhythm; no murmurs, clicks, rubs,  or gallops. ?Abdomen:  Soft, nontender and nondistended. No masses, hepatosplenomegaly or hernias noted. Normal bowel sounds, without guarding, and without rebound.   ?Msk:  Symmetrical without gross deformities. Normal  posture. ?Extremities:  Without clubbing or edema. ?Neurologic:  Alert and  oriented x4;  grossly normal neurologically. ?Skin:  Intact without significant lesions or rashes. ?Cervical Nodes:  No significant cervical adenopathy. ?Psych:  Alert and cooperative. Normal mood and affect. ? ?Impression/Plan: ?[name] is here for a colonoscopy to be performed for high risk colon cancer screening purposes. ? ?The risks of the procedure including infection, bleed, or perforation as well as benefits, limitations, alternatives and imponderables have been reviewed with the patient. Questions have been answered. All parties agreeable. ? ?

## 2021-06-19 NOTE — Anesthesia Postprocedure Evaluation (Signed)
Anesthesia Post Note ? ?Patient: KIENAN DOUBLIN ? ?Procedure(s) Performed: COLONOSCOPY WITH PROPOFOL ?POLYPECTOMY ? ?Patient location during evaluation: Phase II ?Anesthesia Type: General ?Level of consciousness: awake and alert and oriented ?Pain management: pain level controlled ?Vital Signs Assessment: post-procedure vital signs reviewed and stable ?Respiratory status: spontaneous breathing, nonlabored ventilation and respiratory function stable ?Cardiovascular status: blood pressure returned to baseline and stable ?Postop Assessment: no apparent nausea or vomiting ?Anesthetic complications: no ? ? ?No notable events documented. ? ? ?Last Vitals:  ?Vitals:  ? 06/19/21 0724 06/19/21 0839  ?BP: (!) 134/92 118/71  ?Pulse: 88 79  ?Resp: 16 18  ?Temp: 36.9 ?C 36.9 ?C  ?SpO2: 97% 96%  ?  ?Last Pain:  ?Vitals:  ? 06/19/21 0839  ?TempSrc: Oral  ?PainSc: 0-No pain  ? ? ?  ?  ?  ?  ?  ?  ? ?Levander Katzenstein C Maron Stanzione ? ? ? ? ?

## 2021-06-19 NOTE — Discharge Instructions (Signed)
?  Colonoscopy Discharge Instructions  Read the instructions outlined below and refer to this sheet in the next few weeks. These discharge instructions provide you with general information on caring for yourself after you leave the hospital. Your doctor may also give you specific instructions. While your treatment has been planned according to the most current medical practices available, unavoidable complications occasionally occur.   ACTIVITY You may resume your regular activity, but move at a slower pace for the next 24 hours.  Take frequent rest periods for the next 24 hours.  Walking will help get rid of the air and reduce the bloated feeling in your belly (abdomen).  No driving for 24 hours (because of the medicine (anesthesia) used during the test).   Do not sign any important legal documents or operate any machinery for 24 hours (because of the anesthesia used during the test).  NUTRITION Drink plenty of fluids.  You may resume your normal diet as instructed by your doctor.  Begin with a light meal and progress to your normal diet. Heavy or fried foods are harder to digest and may make you feel sick to your stomach (nauseated).  Avoid alcoholic beverages for 24 hours or as instructed.  MEDICATIONS You may resume your normal medications unless your doctor tells you otherwise.  WHAT YOU CAN EXPECT TODAY Some feelings of bloating in the abdomen.  Passage of more gas than usual.  Spotting of blood in your stool or on the toilet paper.  IF YOU HAD POLYPS REMOVED DURING THE COLONOSCOPY: No aspirin products for 7 days or as instructed.  No alcohol for 7 days or as instructed.  Eat a soft diet for the next 24 hours.  FINDING OUT THE RESULTS OF YOUR TEST Not all test results are available during your visit. If your test results are not back during the visit, make an appointment with your caregiver to find out the results. Do not assume everything is normal if you have not heard from your  caregiver or the medical facility. It is important for you to follow up on all of your test results.  SEEK IMMEDIATE MEDICAL ATTENTION IF: You have more than a spotting of blood in your stool.  Your belly is swollen (abdominal distention).  You are nauseated or vomiting.  You have a temperature over 101.  You have abdominal pain or discomfort that is severe or gets worse throughout the day.   Your colonoscopy revealed 3 polyp(s) which I removed successfully. Await pathology results, my office will contact you. I recommend repeating colonoscopy in 5 years for surveillance purposes.   Otherwise follow up with GI as needed.    I hope you have a great rest of your week!  Elaijah Munoz K. Shuan Statzer, D.O. Gastroenterology and Hepatology [facility]  

## 2021-06-19 NOTE — Anesthesia Preprocedure Evaluation (Addendum)
Anesthesia Evaluation  ?Patient identified by MRN, date of birth, ID band ?Patient awake ? ? ? ?Reviewed: ?Allergy & Precautions, NPO status , Patient's Chart, lab work & pertinent test results ? ?Airway ?Mallampati: II ? ?TM Distance: >3 FB ?Neck ROM: Full ? ? ? Dental ? ?(+) Dental Advisory Given, Missing, Chipped,  ?  ?Pulmonary ?sleep apnea (non complaint with CPAP) ,  ?  ?Pulmonary exam normal ?breath sounds clear to auscultation ? ? ? ? ? ? Cardiovascular ?Exercise Tolerance: Good ?hypertension, Pt. on medications ?Normal cardiovascular exam ?Rhythm:Regular Rate:Normal ? ? ?  ?Neuro/Psych ?negative neurological ROS ? negative psych ROS  ? GI/Hepatic ?negative GI ROS, Neg liver ROS,   ?Endo/Other  ?Morbid obesity ? Renal/GU ?negative Renal ROS  ?negative genitourinary ?  ?Musculoskeletal ?negative musculoskeletal ROS ?(+)  ? Abdominal ?  ?Peds ?negative pediatric ROS ?(+)  Hematology ?negative hematology ROS ?(+)   ?Anesthesia Other Findings ? ? Reproductive/Obstetrics ?negative OB ROS ? ?  ? ? ? ? ? ? ? ? ? ? ? ? ? ?  ?  ? ? ? ? ? ? ? ?Anesthesia Physical ?Anesthesia Plan ? ?ASA: 3 ? ?Anesthesia Plan: General  ? ?Post-op Pain Management: Minimal or no pain anticipated  ? ?Induction:  ? ?PONV Risk Score and Plan: Propofol infusion ? ?Airway Management Planned: Nasal Cannula and Natural Airway ? ?Additional Equipment:  ? ?Intra-op Plan:  ? ?Post-operative Plan:  ? ?Informed Consent: I have reviewed the patients History and Physical, chart, labs and discussed the procedure including the risks, benefits and alternatives for the proposed anesthesia with the patient or authorized representative who has indicated his/her understanding and acceptance.  ? ? ? ?Dental advisory given ? ?Plan Discussed with: CRNA and Surgeon ? ?Anesthesia Plan Comments:   ? ? ? ? ? ? ?Anesthesia Quick Evaluation ? ?

## 2021-06-19 NOTE — Transfer of Care (Signed)
Immediate Anesthesia Transfer of Care Note ? ?Patient: Paul Becker ? ?Procedure(s) Performed: COLONOSCOPY WITH PROPOFOL ?POLYPECTOMY ? ?Patient Location: Short Stay ? ?Anesthesia Type:General ? ?Level of Consciousness: awake and alert  ? ?Airway & Oxygen Therapy: Patient Spontanous Breathing ? ?Post-op Assessment: Report given to RN and Post -op Vital signs reviewed and stable ? ?Post vital signs: Reviewed and stable ? ?Last Vitals:  ?Vitals Value Taken Time  ?BP 118/71 06/19/21 0839  ?Temp 36.9 ?C 06/19/21 0839  ?Pulse 79 06/19/21 0839  ?Resp 18 06/19/21 0839  ?SpO2 96 % 06/19/21 0839  ? ? ?Last Pain:  ?Vitals:  ? 06/19/21 0839  ?TempSrc: Oral  ?PainSc: 0-No pain  ?   ? ?Patients Stated Pain Goal: 7 (06/19/21 0724) ? ?Complications: No notable events documented. ?

## 2021-06-19 NOTE — Anesthesia Procedure Notes (Signed)
Date/Time: 06/19/2021 8:22 AM ?Performed by: Karna Dupes, CRNA ?Pre-anesthesia Checklist: Patient identified, Emergency Drugs available, Suction available and Patient being monitored ?Patient Re-evaluated:Patient Re-evaluated prior to induction ?Oxygen Delivery Method: Nasal cannula ?Induction Type: IV induction ?Placement Confirmation: positive ETCO2 ? ? ? ? ?

## 2021-06-19 NOTE — Op Note (Signed)
[facility] ?Patient Name: Paul Becker ?Procedure Date: 06/19/2021 8:07 AM ?MRN: [id number] ?Date of Birth: 1970/06/20 ?Attending MD: Elon Alas. Abbey Chatters , DO ?CSN: [id number] ?Age: 51 ?Admit Type: Outpatient ?Procedure:                Colonoscopy ?Indications:              Colon cancer screening in patient at increased  ?                          risk: Colorectal cancer in mother ?Providers:                Elon Alas. Abbey Chatters, DO, Tammy Vaught, RN, Nelma Rothman,  ?                          Technician ?Referring MD:              ?Medicines:                See the Anesthesia note for documentation of the  ?                          administered medications ?Complications:            No immediate complications. ?Estimated Blood Loss:     Estimated blood loss was minimal. ?Procedure:                Pre-Anesthesia Assessment: ?                          - The anesthesia plan was to use monitored  ?                          anesthesia care (MAC). ?                          After obtaining informed consent, the colonoscope  ?                          was passed under direct vision. Throughout the  ?                          procedure, the patient's blood pressure, pulse, and  ?                          oxygen saturations were monitored continuously. The  ?                          PCF-HQ190L ([id number]) scope was introduced through  ?                          the anus and advanced to the the terminal ileum,  ?                          with identification of the appendiceal orifice and  ?                          IC valve. The colonoscopy was  performed without  ?                          difficulty. The patient tolerated the procedure  ?                          well. The quality of the bowel preparation was  ?                          evaluated using the BBPS [facility] Bowel Preparation  ?                          Scale) with scores of: Right Colon = 2 (minor  ?                          amount of residual staining, small  fragments of  ?                          stool and/or opaque liquid, but mucosa seen well),  ?                          Transverse Colon = 2 (minor amount of residual  ?                          staining, small fragments of stool and/or opaque  ?                          liquid, but mucosa seen well) and Left Colon = 2  ?                          (minor amount of residual staining, small fragments  ?                          of stool and/or opaque liquid, but mucosa seen  ?                          well). The total BBPS score equals 6. The quality  ?                          of the bowel preparation was fair. ?Scope In: 8:15:00 AM ?Scope Out: 8:34:05 AM ?Scope Withdrawal Time: 0 hours 15 minutes 58 seconds  ?Total Procedure Duration: 0 hours 19 minutes 5 seconds  ?Findings: ?     The perianal and digital rectal examinations were normal. ?     Non-bleeding internal hemorrhoids were found during endoscopy. ?     Two sessile polyps were found in the ascending colon. The polyps were 4  ?     to 6 mm in size. These polyps were removed with a cold snare. Resection  ?     and retrieval were complete. ?     A 5 mm polyp was found in the transverse colon. The polyp was sessile.  ?     The polyp was removed with a cold snare. Resection and retrieval were  ?     complete. ?  The terminal ileum appeared normal. ?Impression:               - Preparation of the colon was fair. ?                          - Non-bleeding internal hemorrhoids. ?                          - Two 4 to 6 mm polyps in the ascending colon,  ?                          removed with a cold snare. Resected and retrieved. ?                          - One 5 mm polyp in the transverse colon, removed  ?                          with a cold snare. Resected and retrieved. ?                          - The examined portion of the ileum was normal. ?Moderate Sedation: ?     Per Anesthesia Care ?Recommendation:           - Patient has a contact number available for  ?                           emergencies. The signs and symptoms of potential  ?                          delayed complications were discussed with the  ?                          patient. Return to normal activities tomorrow.  ?                          Written discharge instructions were provided to the  ?                          patient. ?                          - Resume previous diet. ?                          - Continue present medications. ?                          - Await pathology results. ?                          - Repeat colonoscopy in 5 years for surveillance. ?                          - Return to GI clinic PRN. ?Procedure Code(s):        --- Professional --- ?  45385, Colonoscopy, flexible; with removal of  ?                          tumor(s), polyp(s), or other lesion(s) by snare  ?                          technique ?Diagnosis Code(s):        --- Professional --- ?                          K63.5, Polyp of colon ?                          Z80.0, Family history of malignant neoplasm of  ?                          digestive organs ?                          K64.8, Other hemorrhoids ?CPT copyright 2019 [employer]. All rights reserved. ?The codes documented in this report are preliminary and upon coder review may  ?be revised to meet current compliance requirements. ?Elon Alas. Abbey Chatters, DO ?Elon Alas. [place], DO ?06/19/2021 8:36:24 AM ?This report has been signed electronically. ?Number of Addenda: 0 ?

## 2021-06-20 LAB — SURGICAL PATHOLOGY

## 2021-06-21 ENCOUNTER — Encounter (HOSPITAL_COMMUNITY): Payer: Self-pay | Admitting: Internal Medicine

## 2024-06-10 ENCOUNTER — Ambulatory Visit: Admitting: Urology
